# Patient Record
Sex: Female | Born: 1970 | Race: White | Hispanic: No | Smoking: Current every day smoker
Health system: Southern US, Community
[De-identification: ages and names within clinical notes are randomized; demographics above are authoritative.]

## PROBLEM LIST (undated history)

## (undated) DIAGNOSIS — F419 Anxiety disorder, unspecified: Secondary | ICD-10-CM

## (undated) DIAGNOSIS — E039 Hypothyroidism, unspecified: Secondary | ICD-10-CM

## (undated) DIAGNOSIS — T4145XA Adverse effect of unspecified anesthetic, initial encounter: Secondary | ICD-10-CM

## (undated) DIAGNOSIS — D649 Anemia, unspecified: Secondary | ICD-10-CM

## (undated) DIAGNOSIS — R569 Unspecified convulsions: Secondary | ICD-10-CM

## (undated) DIAGNOSIS — R51 Headache: Secondary | ICD-10-CM

## (undated) DIAGNOSIS — K219 Gastro-esophageal reflux disease without esophagitis: Secondary | ICD-10-CM

## (undated) HISTORY — PX: ABDOMINAL HYSTERECTOMY: SHX81

---

## 2000-09-05 ENCOUNTER — Ambulatory Visit (HOSPITAL_COMMUNITY): Admission: RE | Admit: 2000-09-05 | Discharge: 2000-09-05 | Payer: Self-pay | Admitting: Gastroenterology

## 2004-09-21 ENCOUNTER — Ambulatory Visit: Payer: Self-pay | Admitting: Family Medicine

## 2004-11-15 ENCOUNTER — Ambulatory Visit: Payer: Self-pay | Admitting: Family Medicine

## 2005-02-02 ENCOUNTER — Ambulatory Visit: Payer: Self-pay | Admitting: Family Medicine

## 2005-02-13 ENCOUNTER — Ambulatory Visit: Payer: Self-pay | Admitting: Family Medicine

## 2005-04-03 ENCOUNTER — Ambulatory Visit: Payer: Self-pay | Admitting: Family Medicine

## 2005-05-15 ENCOUNTER — Other Ambulatory Visit: Admission: RE | Admit: 2005-05-15 | Discharge: 2005-05-15 | Payer: Self-pay | Admitting: Family Medicine

## 2005-12-15 ENCOUNTER — Inpatient Hospital Stay: Payer: Self-pay | Admitting: Unknown Physician Specialty

## 2006-09-02 ENCOUNTER — Emergency Department (HOSPITAL_COMMUNITY): Admission: EM | Admit: 2006-09-02 | Discharge: 2006-09-02 | Payer: Self-pay | Admitting: Emergency Medicine

## 2006-10-25 ENCOUNTER — Emergency Department (HOSPITAL_COMMUNITY): Admission: EM | Admit: 2006-10-25 | Discharge: 2006-10-25 | Payer: Self-pay | Admitting: Emergency Medicine

## 2006-11-22 ENCOUNTER — Emergency Department (HOSPITAL_COMMUNITY): Admission: EM | Admit: 2006-11-22 | Discharge: 2006-11-22 | Payer: Self-pay | Admitting: Emergency Medicine

## 2007-01-16 ENCOUNTER — Emergency Department (HOSPITAL_COMMUNITY): Admission: EM | Admit: 2007-01-16 | Discharge: 2007-01-16 | Payer: Self-pay | Admitting: Emergency Medicine

## 2007-03-24 ENCOUNTER — Emergency Department (HOSPITAL_COMMUNITY): Admission: EM | Admit: 2007-03-24 | Discharge: 2007-03-24 | Payer: Self-pay | Admitting: Emergency Medicine

## 2007-05-01 ENCOUNTER — Emergency Department (HOSPITAL_COMMUNITY): Admission: EM | Admit: 2007-05-01 | Discharge: 2007-05-01 | Payer: Self-pay | Admitting: Emergency Medicine

## 2007-09-25 ENCOUNTER — Emergency Department (HOSPITAL_COMMUNITY): Admission: EM | Admit: 2007-09-25 | Discharge: 2007-09-25 | Payer: Self-pay | Admitting: Emergency Medicine

## 2007-10-16 ENCOUNTER — Emergency Department (HOSPITAL_COMMUNITY): Admission: EM | Admit: 2007-10-16 | Discharge: 2007-10-16 | Payer: Self-pay | Admitting: Emergency Medicine

## 2008-02-24 ENCOUNTER — Emergency Department (HOSPITAL_COMMUNITY): Admission: EM | Admit: 2008-02-24 | Discharge: 2008-02-24 | Payer: Self-pay | Admitting: Emergency Medicine

## 2008-04-08 ENCOUNTER — Emergency Department (HOSPITAL_COMMUNITY): Admission: EM | Admit: 2008-04-08 | Discharge: 2008-04-08 | Payer: Self-pay | Admitting: Emergency Medicine

## 2008-08-15 ENCOUNTER — Emergency Department (HOSPITAL_COMMUNITY): Admission: EM | Admit: 2008-08-15 | Discharge: 2008-08-15 | Payer: Self-pay | Admitting: Neurology

## 2009-03-16 ENCOUNTER — Emergency Department (HOSPITAL_COMMUNITY): Admission: EM | Admit: 2009-03-16 | Discharge: 2009-03-16 | Payer: Self-pay | Admitting: Emergency Medicine

## 2009-10-28 ENCOUNTER — Emergency Department (HOSPITAL_COMMUNITY): Admission: EM | Admit: 2009-10-28 | Discharge: 2009-10-28 | Payer: Self-pay | Admitting: Emergency Medicine

## 2010-04-25 LAB — DIFFERENTIAL
Basophils Absolute: 0 10*3/uL (ref 0.0–0.1)
Eosinophils Relative: 2 % (ref 0–5)
Lymphs Abs: 2 10*3/uL (ref 0.7–4.0)
Monocytes Relative: 6 % (ref 3–12)
Neutro Abs: 6.4 10*3/uL (ref 1.7–7.7)

## 2010-04-25 LAB — CBC
HCT: 38.5 % (ref 36.0–46.0)
MCHC: 34.1 g/dL (ref 30.0–36.0)
RBC: 4.39 MIL/uL (ref 3.87–5.11)
RDW: 14.9 % (ref 11.5–15.5)
WBC: 9.2 10*3/uL (ref 4.0–10.5)

## 2010-04-25 LAB — BASIC METABOLIC PANEL
CO2: 27 mEq/L (ref 19–32)
Chloride: 103 mEq/L (ref 96–112)
GFR calc Af Amer: >60 mL/min (ref >60)

## 2010-04-25 LAB — POCT CARDIAC MARKERS: Troponin i, poc: <0.05 ng/mL (ref 0.00–0.09)

## 2010-04-25 LAB — D-DIMER, QUANTITATIVE: D-Dimer, Quant: 0.22 ug/mL-FEU (ref 0.00–0.48)

## 2010-05-26 NOTE — Procedures (Signed)
[place]  Patient:    [name], [name] Visit Number: [id number] MRN: [id number]          Service Type: Attending:  Verlin Grills, M.D. Proc. Date: 09/05/00   CC:         Dr. Samuel Jester   Procedure Report  DATE OF BIRTH:  [birth year]  PROCEDURE PERFORMED:  Esophagogastroduodenoscopy.  REFERRING PHYSICIAN:  Dr. Samuel Jester  ENDOSCOPIST:  Verlin Grills, M.D.  INDICATIONS FOR PROCEDURE:  Ms. [name] is a 40 year old female with chronic gastroesophageal reflux disease.  Approximately five years ago she received therapy for Helicobacter pylori antral gastritis.  Despite taking Prilosec 20 mg twice daily, she experiences heartburn without dysphagia or odynophagia.  The patient viewed our esophagogastroduodenoscopy education film.  I discussed laparoscopic antireflux surgery with her.  I gave her samples of Nexium 40 mg each morning.  She is scheduled for a preoperative esophagogastroduodenoscopy.  MEDICATION ALLERGIES:  None.  CHRONIC MEDICATIONS:  Tylenol, Xanax, Zoloft, Nexium.  PAST MEDICAL HISTORY:  Gastroesophageal reflux disease.  PREMEDICATION:  Versed 7.5 mg, fentanyl 50 mcg.  INSTRUMENT USED:  Olympus gastroscope.  DESCRIPTION OF PROCEDURE:  After obtaining informed consent, [name] was placed in the left lateral decubitus position.  I administered intravenous Versed and intravenous fentanyl to achieve conscious sedation for the procedure.  The patients blood pressure, oxygen saturation and cardiac rhythm were monitored throughout the procedure and documented in the medical record.  The Olympus gastroscope was passed through the posterior hypopharynx into the proximal esophagus without difficulty.  The hypopharynx, larynx and vocal cords appeared normal.  Esophagoscopy:  The proximal, mid and lower segments of the esophagus appeared normal.  The squamocolumnar junction and the esophagogastric  junction are noted at approximately 36 cm from the incisor teeth.  Endoscopically there is no evidence for the presence of Barretts esophagus, erosive esophagitis, esophageal mucosa scarring or esophageal ulceration.  Gastroscopy:  Retroflex view of the gastric cardia and fundus was normal.  The gastric body, antrum, and pylorus appeared normal.  A biopsy was taken from the gastric antrum for CLO test.  Duodenoscopy:  The duodenal bulb, mid duodenum and distal duodenum appeared normal.  ASSESSMENT:  Gastroesophageal reflux disease associated with a normal esophagogastroduodenoscopy.  CLO test to rule out Helicobacter pylori antral gastritis pending.  PLAN:  Schedule esophageal manometry and 24 hour esophageal pH study off proton pump inhibitor therapy. Attending:  Verlin Grills, M.D. DD:  09/05/00 TD:  09/05/00 Job: [id number]

## 2010-09-28 LAB — DIFFERENTIAL
Basophils Absolute: 0
Eosinophils Absolute: 0.1
Monocytes Absolute: 0.6
Neutrophils Relative %: 73

## 2010-09-28 LAB — URINALYSIS, ROUTINE W REFLEX MICROSCOPIC
Hgb urine dipstick: NEGATIVE
Nitrite: NEGATIVE
Protein, ur: 30 — AB

## 2010-09-28 LAB — COMPREHENSIVE METABOLIC PANEL
ALT: 30
Albumin: 3.8
Alkaline Phosphatase: 74
BUN: 18
Calcium: 8.8
GFR calc Af Amer: >60
GFR calc non Af Amer: >60
Potassium: 3.1 — ABNORMAL LOW
Total Protein: 6.7

## 2010-09-28 LAB — URINE MICROSCOPIC-ADD ON

## 2010-09-28 LAB — CBC
Hemoglobin: 14.1
MCHC: 34.2
RBC: 4.61

## 2011-01-09 DIAGNOSIS — T8859XA Other complications of anesthesia, initial encounter: Secondary | ICD-10-CM

## 2011-01-09 HISTORY — DX: Other complications of anesthesia, initial encounter: T88.59XA

## 2011-02-19 ENCOUNTER — Emergency Department (HOSPITAL_COMMUNITY): Payer: Medicaid Other

## 2011-02-19 ENCOUNTER — Encounter (HOSPITAL_COMMUNITY): Admission: EM | Disposition: A | Discharge status: Home Health | Payer: Self-pay | Source: Home / Self Care

## 2011-02-19 ENCOUNTER — Inpatient Hospital Stay (HOSPITAL_COMMUNITY): Payer: Medicaid Other

## 2011-02-19 ENCOUNTER — Encounter (HOSPITAL_COMMUNITY): Payer: Self-pay | Admitting: *Deleted

## 2011-02-19 ENCOUNTER — Inpatient Hospital Stay (HOSPITAL_COMMUNITY)
Admission: EM | Admit: 2011-02-19 | Discharge: 2011-02-23 | DRG: 473 | Disposition: A | Discharge status: Home Health | Payer: Medicaid Other | Attending: General Surgery | Admitting: General Surgery

## 2011-02-19 ENCOUNTER — Inpatient Hospital Stay (HOSPITAL_COMMUNITY): Payer: Medicaid Other | Admitting: Anesthesiology

## 2011-02-19 ENCOUNTER — Encounter (HOSPITAL_COMMUNITY): Payer: Self-pay | Admitting: Anesthesiology

## 2011-02-19 DIAGNOSIS — K219 Gastro-esophageal reflux disease without esophagitis: Secondary | ICD-10-CM | POA: Diagnosis present

## 2011-02-19 DIAGNOSIS — E039 Hypothyroidism, unspecified: Secondary | ICD-10-CM | POA: Diagnosis present

## 2011-02-19 DIAGNOSIS — T07XXXA Unspecified multiple injuries, initial encounter: Secondary | ICD-10-CM

## 2011-02-19 DIAGNOSIS — F411 Generalized anxiety disorder: Secondary | ICD-10-CM | POA: Diagnosis present

## 2011-02-19 DIAGNOSIS — S129XXA Fracture of neck, unspecified, initial encounter: Secondary | ICD-10-CM

## 2011-02-19 DIAGNOSIS — S12400A Unspecified displaced fracture of fifth cervical vertebra, initial encounter for closed fracture: Secondary | ICD-10-CM | POA: Diagnosis present

## 2011-02-19 DIAGNOSIS — Z23 Encounter for immunization: Secondary | ICD-10-CM

## 2011-02-19 DIAGNOSIS — Z79899 Other long term (current) drug therapy: Secondary | ICD-10-CM

## 2011-02-19 DIAGNOSIS — S12500A Unspecified displaced fracture of sixth cervical vertebra, initial encounter for closed fracture: Secondary | ICD-10-CM | POA: Diagnosis present

## 2011-02-19 DIAGNOSIS — M25511 Pain in right shoulder: Secondary | ICD-10-CM

## 2011-02-19 DIAGNOSIS — S12300A Unspecified displaced fracture of fourth cervical vertebra, initial encounter for closed fracture: Principal | ICD-10-CM | POA: Diagnosis present

## 2011-02-19 DIAGNOSIS — Z888 Allergy status to other drugs, medicaments and biological substances status: Secondary | ICD-10-CM

## 2011-02-19 HISTORY — PX: ANTERIOR CERVICAL DECOMP/DISCECTOMY FUSION: SHX1161

## 2011-02-19 HISTORY — DX: Anxiety disorder, unspecified: F41.9

## 2011-02-19 HISTORY — DX: Gastro-esophageal reflux disease without esophagitis: K21.9

## 2011-02-19 HISTORY — DX: Hypothyroidism, unspecified: E03.9

## 2011-02-19 HISTORY — PX: POSTERIOR CERVICAL FUSION/FORAMINOTOMY: SHX5038

## 2011-02-19 LAB — CBC
HCT: 35.4 % — ABNORMAL LOW (ref 36.0–46.0)
Hemoglobin: 12 g/dL (ref 12.0–15.0)
MCH: 30 pg (ref 26.0–34.0)
MCHC: 33.9 g/dL (ref 30.0–36.0)
MCV: 88.5 fL (ref 78.0–100.0)
Platelets: 236 10*3/uL (ref 150–400)
RBC: 4 MIL/uL (ref 3.87–5.11)
RDW: 13.1 % (ref 11.5–15.5)
WBC: 18.7 10*3/uL — ABNORMAL HIGH (ref 4.0–10.5)

## 2011-02-19 LAB — COMPREHENSIVE METABOLIC PANEL
ALT: 22 U/L (ref 0–35)
AST: 28 U/L (ref 0–37)
Albumin: 3.7 g/dL (ref 3.5–5.2)
Alkaline Phosphatase: 71 U/L (ref 39–117)
BUN: 11 mg/dL (ref 6–23)
CO2: 26 mEq/L (ref 19–32)
Calcium: 8.7 mg/dL (ref 8.4–10.5)
Chloride: 103 mEq/L (ref 96–112)
Creatinine, Ser: 0.64 mg/dL (ref 0.50–1.10)
GFR calc Af Amer: >90 mL/min (ref >90)
GFR calc non Af Amer: >90 mL/min (ref >90)
Glucose, Bld: 96 mg/dL (ref 70–99)
Potassium: 3.5 mEq/L (ref 3.5–5.1)
Sodium: 139 mEq/L (ref 135–145)
Total Bilirubin: 0.5 mg/dL (ref 0.3–1.2)
Total Protein: 6.6 g/dL (ref 6.0–8.3)

## 2011-02-19 LAB — URINALYSIS, ROUTINE W REFLEX MICROSCOPIC
Leukocytes, UA: NEGATIVE
Nitrite: NEGATIVE
Specific Gravity, Urine: 1.015 (ref 1.005–1.030)
pH: 6.5 (ref 5.0–8.0)

## 2011-02-19 LAB — MRSA PCR SCREENING: MRSA by PCR: NEGATIVE

## 2011-02-19 SURGERY — ANTERIOR CERVICAL DECOMPRESSION/DISCECTOMY FUSION 2 LEVELS
Anesthesia: General | Site: Spine Cervical | Wound class: Clean

## 2011-02-19 MED ORDER — HYDROXYZINE HCL 50 MG PO TABS
50.0000 mg | ORAL_TABLET | ORAL | Status: DC | PRN
  Filled 2011-02-19: qty 1

## 2011-02-19 MED ORDER — NEOSTIGMINE METHYLSULFATE 1 MG/ML IJ SOLN
INTRAMUSCULAR | Status: DC | PRN
  Administered 2011-02-19: 2 mg via INTRAVENOUS

## 2011-02-19 MED ORDER — BACITRACIN 50000 UNITS IM SOLR
INTRAMUSCULAR | Status: AC
  Filled 2011-02-19: qty 1

## 2011-02-19 MED ORDER — PROPOFOL 10 MG/ML IV BOLUS
INTRAVENOUS | Status: DC | PRN
  Administered 2011-02-19: 200 mg via INTRAVENOUS

## 2011-02-19 MED ORDER — ONDANSETRON HCL 4 MG/2ML IJ SOLN
INTRAMUSCULAR | Status: DC | PRN
  Administered 2011-02-19: 4 mg via INTRAVENOUS

## 2011-02-19 MED ORDER — POTASSIUM CHLORIDE IN NACL 20-0.9 MEQ/L-% IV SOLN
INTRAVENOUS | Status: DC
  Filled 2011-02-19 (×2): qty 1000

## 2011-02-19 MED ORDER — FENTANYL CITRATE 0.05 MG/ML IJ SOLN
INTRAMUSCULAR | Status: AC
  Administered 2011-02-20: 75 ug via INTRAVENOUS
  Filled 2011-02-19: qty 2

## 2011-02-19 MED ORDER — KETOROLAC TROMETHAMINE 30 MG/ML IJ SOLN
INTRAMUSCULAR | Status: AC
  Administered 2011-02-19: 30 mg via INTRAVENOUS
  Filled 2011-02-19: qty 1

## 2011-02-19 MED ORDER — MORPHINE SULFATE 4 MG/ML IJ SOLN
4.0000 mg | INTRAMUSCULAR | Status: DC | PRN
  Administered 2011-02-19 – 2011-02-20 (×2): 4 mg via INTRAMUSCULAR
  Filled 2011-02-19 (×4): qty 1

## 2011-02-19 MED ORDER — SODIUM CHLORIDE 0.9 % IV SOLN
INTRAVENOUS | Status: AC
  Filled 2011-02-19: qty 500

## 2011-02-19 MED ORDER — HYDROMORPHONE HCL PF 1 MG/ML IJ SOLN
INTRAMUSCULAR | Status: AC
  Administered 2011-02-19: 0.25 mg via INTRAVENOUS
  Filled 2011-02-19: qty 1

## 2011-02-19 MED ORDER — LIDOCAINE-EPINEPHRINE 1 %-1:100000 IJ SOLN
INTRAMUSCULAR | Status: DC | PRN
  Administered 2011-02-19: 5 mL

## 2011-02-19 MED ORDER — KETOROLAC TROMETHAMINE 30 MG/ML IJ SOLN
30.0000 mg | Freq: Four times a day (QID) | INTRAMUSCULAR | Status: AC
  Administered 2011-02-20 – 2011-02-21 (×8): 30 mg via INTRAVENOUS
  Filled 2011-02-19 (×9): qty 1

## 2011-02-19 MED ORDER — SODIUM CHLORIDE 0.9 % IJ SOLN: 3.0000 mL | INTRAMUSCULAR | Status: DC | PRN

## 2011-02-19 MED ORDER — MENTHOL 3 MG MT LOZG: 1.0000 | LOZENGE | OROMUCOSAL | Status: DC | PRN

## 2011-02-19 MED ORDER — PANTOPRAZOLE SODIUM 40 MG PO TBEC
40.0000 mg | DELAYED_RELEASE_TABLET | Freq: Every day | ORAL | Status: DC
  Administered 2011-02-20 – 2011-02-23 (×4): 40 mg via ORAL
  Filled 2011-02-19 (×4): qty 1

## 2011-02-19 MED ORDER — CEFAZOLIN SODIUM 1-5 GM-% IV SOLN
1.0000 g | INTRAVENOUS | Status: AC
  Administered 2011-02-19: 1 g via INTRAVENOUS
  Filled 2011-02-19 (×3): qty 50

## 2011-02-19 MED ORDER — SUCCINYLCHOLINE CHLORIDE 20 MG/ML IJ SOLN
INTRAMUSCULAR | Status: DC | PRN
  Administered 2011-02-19: 140 mg via INTRAVENOUS

## 2011-02-19 MED ORDER — ONDANSETRON HCL 4 MG/2ML IJ SOLN: 4.0000 mg | Freq: Four times a day (QID) | INTRAMUSCULAR | Status: DC | PRN

## 2011-02-19 MED ORDER — BACITRACIN ZINC 500 UNIT/GM EX OINT
TOPICAL_OINTMENT | CUTANEOUS | Status: AC
  Administered 2011-02-19: 13:00:00
  Filled 2011-02-19: qty 1.8

## 2011-02-19 MED ORDER — ACETAMINOPHEN 650 MG RE SUPP: 650.0000 mg | RECTAL | Status: DC | PRN

## 2011-02-19 MED ORDER — PANTOPRAZOLE SODIUM 40 MG IV SOLR
40.0000 mg | Freq: Every day | INTRAVENOUS | Status: DC
  Filled 2011-02-19 (×3): qty 40

## 2011-02-19 MED ORDER — HYDROMORPHONE HCL PF 1 MG/ML IJ SOLN
0.2500 mg | INTRAMUSCULAR | Status: DC | PRN
  Administered 2011-02-19: 0.25 mg via INTRAVENOUS
  Administered 2011-02-19: 0.5 mg via INTRAVENOUS

## 2011-02-19 MED ORDER — ONDANSETRON HCL 4 MG PO TABS: 4.0000 mg | ORAL_TABLET | Freq: Four times a day (QID) | ORAL | Status: DC | PRN

## 2011-02-19 MED ORDER — HYDROXYZINE HCL 50 MG/ML IM SOLN
50.0000 mg | INTRAMUSCULAR | Status: DC | PRN
  Filled 2011-02-19: qty 1

## 2011-02-19 MED ORDER — CYCLOBENZAPRINE HCL 10 MG PO TABS
10.0000 mg | ORAL_TABLET | Freq: Three times a day (TID) | ORAL | Status: DC | PRN
  Administered 2011-02-20 – 2011-02-23 (×8): 10 mg via ORAL
  Filled 2011-02-19 (×8): qty 1

## 2011-02-19 MED ORDER — SODIUM CHLORIDE 0.9 % IV BOLUS (SEPSIS)
1000.0000 mL | Freq: Once | INTRAVENOUS | Status: AC
  Administered 2011-02-19: 1000 mL via INTRAVENOUS

## 2011-02-19 MED ORDER — 0.9 % SODIUM CHLORIDE (POUR BTL) OPTIME
TOPICAL | Status: DC | PRN
  Administered 2011-02-19: 1000 mL

## 2011-02-19 MED ORDER — IOHEXOL 350 MG/ML SOLN
75.0000 mL | Freq: Once | INTRAVENOUS | Status: AC | PRN
  Administered 2011-02-19: 75 mL via INTRAVENOUS

## 2011-02-19 MED ORDER — SODIUM CHLORIDE 0.9 % IJ SOLN
3.0000 mL | Freq: Two times a day (BID) | INTRAMUSCULAR | Status: DC
  Administered 2011-02-19 – 2011-02-23 (×8): 3 mL via INTRAVENOUS

## 2011-02-19 MED ORDER — EPHEDRINE SULFATE 50 MG/ML IJ SOLN
INTRAMUSCULAR | Status: DC | PRN
  Administered 2011-02-19 (×2): 10 mg via INTRAVENOUS

## 2011-02-19 MED ORDER — FENTANYL CITRATE 0.05 MG/ML IJ SOLN
50.0000 ug | INTRAMUSCULAR | Status: DC | PRN
  Administered 2011-02-19: 50 ug via INTRAVENOUS
  Administered 2011-02-20: 75 ug via INTRAVENOUS
  Administered 2011-02-22: 50 ug via INTRAVENOUS
  Filled 2011-02-19 (×2): qty 2

## 2011-02-19 MED ORDER — FENTANYL CITRATE 0.05 MG/ML IJ SOLN
INTRAMUSCULAR | Status: DC | PRN
  Administered 2011-02-19 (×8): 50 ug via INTRAVENOUS
  Administered 2011-02-19: 100 ug via INTRAVENOUS

## 2011-02-19 MED ORDER — ACETAMINOPHEN 325 MG PO TABS: 650.0000 mg | ORAL_TABLET | ORAL | Status: DC | PRN

## 2011-02-19 MED ORDER — ALUM & MAG HYDROXIDE-SIMETH 200-200-20 MG/5ML PO SUSP: 30.0000 mL | Freq: Four times a day (QID) | ORAL | Status: DC | PRN

## 2011-02-19 MED ORDER — ONDANSETRON HCL 4 MG/2ML IJ SOLN
INTRAMUSCULAR | Status: AC
  Filled 2011-02-19: qty 2

## 2011-02-19 MED ORDER — THROMBIN 5000 UNITS EX SOLR
OROMUCOSAL | Status: DC | PRN
  Administered 2011-02-19: 18:00:00 via TOPICAL

## 2011-02-19 MED ORDER — BISACODYL 10 MG RE SUPP: 10.0000 mg | Freq: Every day | RECTAL | Status: DC | PRN

## 2011-02-19 MED ORDER — HYDROMORPHONE HCL PF 1 MG/ML IJ SOLN
1.0000 mg | Freq: Once | INTRAMUSCULAR | Status: AC
  Administered 2011-02-19: 1 mg via INTRAVENOUS
  Filled 2011-02-19: qty 1

## 2011-02-19 MED ORDER — THROMBIN 20000 UNITS EX KIT
PACK | CUTANEOUS | Status: DC | PRN
  Administered 2011-02-19: 17:00:00 via TOPICAL

## 2011-02-19 MED ORDER — MORPHINE SULFATE 4 MG/ML IJ SOLN
4.0000 mg | Freq: Once | INTRAMUSCULAR | Status: AC
  Administered 2011-02-19: 4 mg via INTRAVENOUS

## 2011-02-19 MED ORDER — MIDAZOLAM HCL 5 MG/5ML IJ SOLN
INTRAMUSCULAR | Status: DC | PRN
  Administered 2011-02-19 (×2): 1 mg via INTRAVENOUS

## 2011-02-19 MED ORDER — MAGNESIUM HYDROXIDE 400 MG/5ML PO SUSP: 30.0000 mL | Freq: Every day | ORAL | Status: DC | PRN

## 2011-02-19 MED ORDER — OXYCODONE-ACETAMINOPHEN 5-325 MG PO TABS
1.0000 | ORAL_TABLET | ORAL | Status: DC | PRN
  Administered 2011-02-22 – 2011-02-23 (×5): 2 via ORAL
  Filled 2011-02-19 (×6): qty 2

## 2011-02-19 MED ORDER — KCL IN DEXTROSE-NACL 20-5-0.45 MEQ/L-%-% IV SOLN
INTRAVENOUS | Status: DC
  Administered 2011-02-19 – 2011-02-21 (×3): via INTRAVENOUS
  Filled 2011-02-19 (×6): qty 1000

## 2011-02-19 MED ORDER — KETOROLAC TROMETHAMINE 30 MG/ML IJ SOLN
30.0000 mg | Freq: Once | INTRAMUSCULAR | Status: AC
  Administered 2011-02-19: 30 mg via INTRAVENOUS

## 2011-02-19 MED ORDER — GLYCOPYRROLATE 0.2 MG/ML IJ SOLN
INTRAMUSCULAR | Status: DC | PRN
  Administered 2011-02-19: .4 mg via INTRAVENOUS

## 2011-02-19 MED ORDER — MORPHINE SULFATE 4 MG/ML IJ SOLN
INTRAMUSCULAR | Status: AC
  Filled 2011-02-19: qty 1

## 2011-02-19 MED ORDER — PHENOL 1.4 % MT LIQD
1.0000 | OROMUCOSAL | Status: DC | PRN
  Administered 2011-02-20: 1 via OROMUCOSAL
  Filled 2011-02-19: qty 177

## 2011-02-19 MED ORDER — SODIUM CHLORIDE 0.9 % IV SOLN: 250.0000 mL | INTRAVENOUS | Status: DC

## 2011-02-19 MED ORDER — BUPIVACAINE HCL (PF) 0.5 % IJ SOLN
INTRAMUSCULAR | Status: DC | PRN
  Administered 2011-02-19: 5 mL

## 2011-02-19 MED ORDER — VECURONIUM BROMIDE 10 MG IV SOLR
INTRAVENOUS | Status: DC | PRN
  Administered 2011-02-19: 10 mg via INTRAVENOUS

## 2011-02-19 MED ORDER — LACTATED RINGERS IV SOLN
INTRAVENOUS | Status: DC | PRN
  Administered 2011-02-19 (×2): via INTRAVENOUS

## 2011-02-19 MED ORDER — ONDANSETRON HCL 4 MG/2ML IJ SOLN
4.0000 mg | Freq: Once | INTRAMUSCULAR | Status: AC
  Administered 2011-02-19: 4 mg via INTRAVENOUS

## 2011-02-19 MED ORDER — HYDROCODONE-ACETAMINOPHEN 5-325 MG PO TABS
1.0000 | ORAL_TABLET | ORAL | Status: DC | PRN
  Administered 2011-02-20: 1 via ORAL
  Administered 2011-02-21 – 2011-02-22 (×5): 2 via ORAL
  Filled 2011-02-19 (×3): qty 2
  Filled 2011-02-19: qty 1
  Filled 2011-02-19 (×2): qty 2

## 2011-02-19 MED ORDER — SODIUM CHLORIDE 0.9 % IR SOLN
Status: DC | PRN
  Administered 2011-02-19: 17:00:00

## 2011-02-19 MED ORDER — TETANUS-DIPHTH-ACELL PERTUSSIS 5-2.5-18.5 LF-MCG/0.5 IM SUSP
0.5000 mL | Freq: Once | INTRAMUSCULAR | Status: AC
  Administered 2011-02-19: 0.5 mL via INTRAMUSCULAR
  Filled 2011-02-19: qty 0.5

## 2011-02-19 SURGICAL SUPPLY — 85 items
ADH SKN CLS APL DERMABOND .7 (GAUZE/BANDAGES/DRESSINGS) ×2
ADH SKN CLS LQ APL DERMABOND (GAUZE/BANDAGES/DRESSINGS) ×2
ALLOGRAFT CA 6X14X11 (Bone Implant) ×2 IMPLANT
BAG DECANTER FOR FLEXI CONT (MISCELLANEOUS) ×5 IMPLANT
BANDAGE GAUZE 4  KLING STR (GAUZE/BANDAGES/DRESSINGS) IMPLANT
BIT DRILL NEURO 2X3.1 SFT TUCH (MISCELLANEOUS) ×4 IMPLANT
BIT DRILL WIRE PASS 1.3MM (BIT) IMPLANT
BLADE SURG 11 STRL SS (BLADE) ×3 IMPLANT
BLADE SURG ROTATE 9660 (MISCELLANEOUS) ×2 IMPLANT
BLADE ULTRA TIP 2M (BLADE) ×3 IMPLANT
BRUSH SCRUB EZ PLAIN DRY (MISCELLANEOUS) ×3 IMPLANT
CANISTER SUCTION 2500CC (MISCELLANEOUS) ×5 IMPLANT
CLIP TI MEDIUM 6 (CLIP) ×1 IMPLANT
CLOTH BEACON ORANGE TIMEOUT ST (SAFETY) ×5 IMPLANT
CONT SPEC 4OZ CLIKSEAL STRL BL (MISCELLANEOUS) ×5 IMPLANT
COVER MAYO STAND STRL (DRAPES) ×3 IMPLANT
COVER TABLE BACK 60X90 (DRAPES) ×2 IMPLANT
DECANTER SPIKE VIAL GLASS SM (MISCELLANEOUS) ×4 IMPLANT
DERMABOND ADHESIVE PROPEN (GAUZE/BANDAGES/DRESSINGS) ×1
DERMABOND ADVANCED (GAUZE/BANDAGES/DRESSINGS) ×1
DERMABOND ADVANCED .7 DNX12 (GAUZE/BANDAGES/DRESSINGS) ×4 IMPLANT
DERMABOND ADVANCED .7 DNX6 (GAUZE/BANDAGES/DRESSINGS) IMPLANT
DRAPE C-ARM 42X72 X-RAY (DRAPES) ×4 IMPLANT
DRAPE LAPAROTOMY 100X72 PEDS (DRAPES) ×5 IMPLANT
DRAPE MICROSCOPE LEICA (MISCELLANEOUS) ×2 IMPLANT
DRAPE POUCH INSTRU U-SHP 10X18 (DRAPES) ×5 IMPLANT
DRAPE PROXIMA HALF (DRAPES) IMPLANT
DRILL NEURO 2X3.1 SOFT TOUCH (MISCELLANEOUS) ×3
DRILL WIRE PASS 1.3MM (BIT)
DRSG EMULSION OIL 3X3 NADH (GAUZE/BANDAGES/DRESSINGS) IMPLANT
ELECT COATED BLADE 2.86 ST (ELECTRODE) ×3 IMPLANT
ELECT REM PT RETURN 9FT ADLT (ELECTROSURGICAL) ×3
ELECTRODE REM PT RTRN 9FT ADLT (ELECTROSURGICAL) ×4 IMPLANT
EVACUATOR 1/8 PVC DRAIN (DRAIN) IMPLANT
GAUZE SPONGE 4X4 16PLY XRAY LF (GAUZE/BANDAGES/DRESSINGS) ×1 IMPLANT
GLOVE BIOGEL PI IND STRL 7.5 (GLOVE) IMPLANT
GLOVE BIOGEL PI IND STRL 8 (GLOVE) ×4 IMPLANT
GLOVE BIOGEL PI IND STRL 8.5 (GLOVE) IMPLANT
GLOVE BIOGEL PI INDICATOR 7.5 (GLOVE) ×1
GLOVE BIOGEL PI INDICATOR 8 (GLOVE) ×2
GLOVE BIOGEL PI INDICATOR 8.5 (GLOVE) ×2
GLOVE ECLIPSE 7.5 STRL STRAW (GLOVE) ×7 IMPLANT
GLOVE ECLIPSE 8.5 STRL (GLOVE) ×1 IMPLANT
GLOVE EXAM NITRILE LRG STRL (GLOVE) IMPLANT
GLOVE EXAM NITRILE MD LF STRL (GLOVE) IMPLANT
GLOVE EXAM NITRILE XL STR (GLOVE) IMPLANT
GLOVE EXAM NITRILE XS STR PU (GLOVE) IMPLANT
GLOVE SURG SS PI 6.5 STRL IVOR (GLOVE) ×1 IMPLANT
GOWN BRE IMP SLV AUR LG STRL (GOWN DISPOSABLE) ×1 IMPLANT
GOWN BRE IMP SLV AUR XL STRL (GOWN DISPOSABLE) ×3 IMPLANT
GOWN STRL REIN 2XL LVL4 (GOWN DISPOSABLE) ×2 IMPLANT
HEAD HALTER (SOFTGOODS) ×3 IMPLANT
HEMOSTAT SURGICEL 2X14 (HEMOSTASIS) IMPLANT
KIT BASIN OR (CUSTOM PROCEDURE TRAY) ×5 IMPLANT
KIT ROOM TURNOVER OR (KITS) ×5 IMPLANT
NDL HYPO 18GX1.5 BLUNT FILL (NEEDLE) IMPLANT
NDL HYPO 25X1 1.5 SAFETY (NEEDLE) ×2 IMPLANT
NDL SPNL 18GX3.5 QUINCKE PK (NEEDLE) ×2 IMPLANT
NDL SPNL 22GX3.5 QUINCKE BK (NEEDLE) ×4 IMPLANT
NEEDLE HYPO 18GX1.5 BLUNT FILL (NEEDLE) IMPLANT
NEEDLE HYPO 25X1 1.5 SAFETY (NEEDLE) ×3 IMPLANT
NEEDLE SPNL 18GX3.5 QUINCKE PK (NEEDLE) IMPLANT
NEEDLE SPNL 22GX3.5 QUINCKE BK (NEEDLE) ×6 IMPLANT
NS IRRIG 1000ML POUR BTL (IV SOLUTION) ×5 IMPLANT
PACK LAMINECTOMY NEURO (CUSTOM PROCEDURE TRAY) ×5 IMPLANT
PAD ARMBOARD 7.5X6 YLW CONV (MISCELLANEOUS) ×15 IMPLANT
PIN MAYFIELD SKULL DISP (PIN) ×2 IMPLANT
RUBBERBAND STERILE (MISCELLANEOUS) ×6 IMPLANT
SPONGE GAUZE 4X4 12PLY (GAUZE/BANDAGES/DRESSINGS) IMPLANT
SPONGE INTESTINAL PEANUT (DISPOSABLE) ×3 IMPLANT
SPONGE LAP 4X18 X RAY DECT (DISPOSABLE) IMPLANT
SPONGE SURGIFOAM ABS GEL 100 (HEMOSTASIS) ×3 IMPLANT
SPONGE SURGIFOAM ABS GEL SZ50 (HEMOSTASIS) ×2 IMPLANT
STAPLER SKIN PROX WIDE 3.9 (STAPLE) ×2 IMPLANT
SUT ETHILON 3 0 FSL (SUTURE) IMPLANT
SUT VIC AB 0 CT1 18XCR BRD8 (SUTURE) ×2 IMPLANT
SUT VIC AB 0 CT1 8-18 (SUTURE) ×3
SUT VIC AB 2-0 CP2 18 (SUTURE) ×5 IMPLANT
SUT VIC AB 3-0 SH 8-18 (SUTURE) ×3 IMPLANT
SYR 20ML ECCENTRIC (SYRINGE) ×5 IMPLANT
TOWEL OR 17X24 6PK STRL BLUE (TOWEL DISPOSABLE) ×5 IMPLANT
TOWEL OR 17X26 10 PK STRL BLUE (TOWEL DISPOSABLE) ×5 IMPLANT
TRAY FOLEY CATH 14FRSI W/METER (CATHETERS) IMPLANT
UNDERPAD 30X30 INCONTINENT (UNDERPADS AND DIAPERS) ×2 IMPLANT
WATER STERILE IRR 1000ML POUR (IV SOLUTION) ×5 IMPLANT

## 2011-02-19 NOTE — Plan of Care (Signed)
Problem: Consults Goal: Diagnosis - Spinal Surgery Outcome: Completed/Met Date Met:  02/19/11 Cervical Spine Fusion for Cervical fractures
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
## 2011-02-19 NOTE — Op Note (Signed)
02/19/2011  5:35 PM  PATIENT:  [name]  41 y.o. female  PRE-OPERATIVE DIAGNOSIS:  cervical fracture dislocation  POST-OPERATIVE DIAGNOSIS:  Cervical fracture Dislocation  PROCEDURE:  Procedure(s): ANTERIOR CERVICAL DECOMPRESSION/DISCECTOMY FUSION 2 LEVELS: C4-5 and C5-6 anterior cervical decompression and arthrodesis with allograft and tether cervical plating  SURGEON:  Surgeon(s): [name], MD Stefani Dama, MD  ASSISTANTS: Stefani Dama M.D.  ANESTHESIA:   general  EBL:  Total I/O In: 2000 [I.V.:2000] Out: 975 [Urine:950; Blood:25]  BLOOD ADMINISTERED:none  COUNT: Correct per nursing staff  DICTATION: Patient was brought to the operating room placed under general endotracheal anesthesia. Patient was placed in 10 pounds of halter traction. The neck was prepped with Betadine soap and solution and draped in a sterile fashion. A horizontal incision was made on the left side of the neck. The line of the incision was infiltrated with local anesthetic with epinephrine. Dissection was carried down thru the subcutaneous tissue and platysma, bipolar cautery was used to maintain hemostasis. Dissection was then carried out thru an avascular plane leaving the sternocleidomastoid carotid artery and jugular vein laterally and the trachea and esophagus medially. The ventral aspect of the vertebral column was identified and a localizing x-ray was taken. The C4-5 and C5-6 levels were identified. The annulus at each level was incised and the disc space entered. Discectomy was performed with micro-curettes and pituitary rongeurs. There was notable laxity of the intravertebral disc space at the C5-6 level. The operating microscope was draped and brought into the field provided additional magnification illumination and visualization. Discectomy was continued posteriorly thru the disc space and then the cartilaginous endplate was removed using micro-curettes along with the high-speed drill.  Posterior osteophytic overgrowth was removed each level using the high-speed drill along with a 2 mm thin footplated Kerrison punch. Posterior longitudinal ligament was carefully removed, decompressing the spinal canal and thecal sac. We then continued to remove osteophytic overgrowth and disc material decompressing the neural foramina and exiting nerve roots bilaterally. We did find a fairly large fragment of disc laterally and the left C5-6 neural foramen. Once the decompression was completed hemostasis was established at each level with the use of Gelfoam with thrombin and bipolar cautery. The Gelfoam was removed the wound irrigated and hemostasis confirmed. We then measured the height of the intravertebral disc space level and selected a 6 millimeter in height structural allograft for the C4-5 level and a 6 millimeter in height structural allograft for the C5-6 level . Each was hydrated and saline solution and then gently positioned in the intravertebral disc space and countersunk. We then selected a 32 millimeter in height Tether cervical plate. It was positioned over the fusion construct and secured to the vertebra with 4 x 13 mm screws, placing a fixed screw at C5 and a pair of variable screws at C4 and C6. Each screw hole was started with the high-speed drill and then the screws placed, once all the screws were placed final tightening was performed. The wound was irrigated with bacitracin solution checked for hemostasis which was established and confirmed. An x-ray was taken which showed grafts in good position, the plate and screws in good position, the overall alignment to be good, and it was felt that we achieved good reduction of the anterolisthesis C5 on 6. We then proceeded with closure. The platysma was closed with interrupted inverted 2-0 undyed Vicryl suture, the subcutaneous and subcuticular closed with interrupted inverted 3-0 undyed Vicryl suture. The skin edges were  approximated with Dermabond.  Following surgery the patient was taken out of cervical traction. To be reversed and the anesthetic and taken to the recovery room for further care.   PLAN OF CARE: Admit to inpatient   PATIENT DISPOSITION:  PACU - hemodynamically stable.   Delay start of Pharmacological VTE agent (>24hrs) due to surgical blood loss or risk of bleeding:  yes

## 2011-02-19 NOTE — ED Notes (Signed)
Attempt an i&o unable to get urine at this time
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
## 2011-02-19 NOTE — Transfer of Care (Signed)
Immediate Anesthesia Transfer of Care Note  Patient: [name]  Procedure(s) Performed: Procedure(s) (LRB): ANTERIOR CERVICAL DECOMPRESSION/DISCECTOMY FUSION 2 LEVELS (N/A) POSTERIOR CERVICAL FUSION/FORAMINOTOMY LEVEL 2 (N/A)  Patient Location: PACU  Anesthesia Type: General  Level of Consciousness: awake  Airway & Oxygen Therapy: Patient Spontanous Breathing and Patient connected to nasal cannula oxygen  Post-op Assessment: Report given to PACU RN and Patient moving all extremities X 4  Post vital signs: Reviewed and stable  Complications: No apparent anesthesia complications

## 2011-02-19 NOTE — H&P (Signed)
Patient discussed with Dr. Newell Coral.  Completing work-up with CTA C-spine, CT chest/abdomen/pelvis now.  Plan to go to the OR with Dr. Newell Coral afterwards. Patient examined and I agree with the assessment and plan  Violeta Gelinas, MD, MPH, FACS Pager: [phone]  02/19/2011 1:47 PM

## 2011-02-19 NOTE — Anesthesia Postprocedure Evaluation (Signed)
  Anesthesia Post-op Note  Patient: [name]  Procedure(s) Performed: Procedure(s) (LRB): ANTERIOR CERVICAL DECOMPRESSION/DISCECTOMY FUSION 2 LEVELS (N/A) POSTERIOR CERVICAL FUSION/FORAMINOTOMY LEVEL 2 (N/A)  Patient Location: PACU  Anesthesia Type: General  Level of Consciousness: awake, alert , oriented and patient cooperative  Airway and Oxygen Therapy: Patient Spontanous Breathing and Patient connected to nasal cannula oxygen  Post-op Pain: mild  Post-op Assessment: Post-op Vital signs reviewed, Patient's Cardiovascular Status Stable, Respiratory Function Stable, Patent Airway, No signs of Nausea or vomiting and Pain level controlled  Post-op Vital Signs: stable  Complications: No apparent anesthesia complications
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
## 2011-02-19 NOTE — Anesthesia Preprocedure Evaluation (Addendum)
Anesthesia Evaluation  Patient identified by MRN, date of birth, ID band Patient awake    Reviewed: Allergy & Precautions, H&P , NPO status , Patient's Chart, lab work & pertinent test results  Airway Mallampati: III  Neck ROM: Limited    Dental  (+) Teeth Intact   Pulmonary Current Smoker,          Cardiovascular     Neuro/Psych    GI/Hepatic GERD-  Medicated and Controlled,  Endo/Other  Hypothyroidism   Renal/GU      Musculoskeletal   Abdominal   Peds  Hematology   Anesthesia Other Findings In c-collar, s/p c-spine fx.  Neck ROM not evaluated   Reproductive/Obstetrics                          Anesthesia Physical Anesthesia Plan  ASA: III  Anesthesia Plan: General   Post-op Pain Management:    Induction: Intravenous  Airway Management Planned: Video Laryngoscope Planned and Oral ETT  Additional Equipment:   Intra-op Plan:   Post-operative Plan: Extubation in OR  Informed Consent: I have reviewed the patients History and Physical, chart, labs and discussed the procedure including the risks, benefits and alternatives for the proposed anesthesia with the patient or authorized representative who has indicated his/her understanding and acceptance.     Plan Discussed with: CRNA and Surgeon  Anesthesia Plan Comments:         Anesthesia Quick Evaluation
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
## 2011-02-19 NOTE — Progress Notes (Signed)
Filed Vitals:   02/19/11 1140 02/19/11 1217 02/19/11 1320 02/19/11 1415  BP: 113/65 113/65 112/75 116/74  Pulse: 80 90  86  Temp: 97.5 F (36.4 C) 97.5 F (36.4 C)    TempSrc:      Resp: 18 18 15 17   Height:   4\' 11"  (1.499 m)   Weight:   61.2 kg (134 lb 14.7 oz)   SpO2: 98% 93% 97% 98%    CBC  Basename 02/19/11 1130  WBC 18.7*  HGB 12.0  HCT 35.4*  PLT 236   BMET  Basename 02/19/11 1130  NA 139  K 3.5  CL 103  CO2 26  GLUCOSE 96  BUN 11  CREATININE 0.64  CALCIUM 8.7    Patient in recovery room, fairly comfortable, certainly more comfortable than prior to surgery. Wound clean and dry. Moving all 4 extremities well, with 5 over 5 strength. Foley to straight drainage.  He was felt that the final x-ray in surgery showed good reduction of the dislocation and therefore it was decided not to proceed with the posterior stabilization for now. We will plan on checking x-rays and possibly CT during the recuperative period to decide whether posterior stabilization will be necessary.  Plan: Once stabilized in recovery room to be transferred back to ICU. We'll allow head of bed to be up to 20-30. We'll leave n.p.o. for now except for meds.  [name], MD 02/19/2011, 6:06 PM

## 2011-02-19 NOTE — Anesthesia Procedure Notes (Signed)
Procedure Name: Intubation Date/Time: 02/19/2011 3:37 PM Performed by: Alanda Amass Pre-anesthesia Checklist: Patient identified, Emergency Drugs available, Timeout performed, Suction available and Patient being monitored Patient Re-evaluated:Patient Re-evaluated prior to inductionOxygen Delivery Method: Circle System Utilized Preoxygenation: Pre-oxygenation with 100% oxygen Intubation Type: IV induction Ventilation: Mask ventilation without difficulty Tube type: Oral Number of attempts: 1 Airway Equipment and Method: stylet and video-laryngoscopy Placement Confirmation: ETT inserted through vocal cords under direct vision,  positive ETCO2 and breath sounds checked- equal and bilateral Secured at: 21 cm Tube secured with: Tape Dental Injury: Teeth and Oropharynx as per pre-operative assessment  Comments: C-Collar on.  No extension or flexion during intubation

## 2011-02-19 NOTE — Progress Notes (Signed)
UR of chart complete.  
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
## 2011-02-19 NOTE — ED Notes (Signed)
Patient transported to CT 
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
## 2011-02-20 ENCOUNTER — Inpatient Hospital Stay (HOSPITAL_COMMUNITY): Payer: Medicaid Other

## 2011-02-20 ENCOUNTER — Encounter (HOSPITAL_COMMUNITY): Payer: Self-pay | Admitting: Neurosurgery

## 2011-02-20 DIAGNOSIS — Z720 Tobacco use: Secondary | ICD-10-CM | POA: Insufficient documentation

## 2011-02-20 DIAGNOSIS — E039 Hypothyroidism, unspecified: Secondary | ICD-10-CM | POA: Insufficient documentation

## 2011-02-20 DIAGNOSIS — F1290 Cannabis use, unspecified, uncomplicated: Secondary | ICD-10-CM | POA: Insufficient documentation

## 2011-02-20 DIAGNOSIS — Z8719 Personal history of other diseases of the digestive system: Secondary | ICD-10-CM | POA: Insufficient documentation

## 2011-02-20 LAB — ABO/RH: ABO/RH(D): A POS

## 2011-02-20 LAB — CBC
HCT: 32.3 % — ABNORMAL LOW (ref 36.0–46.0)
Hemoglobin: 10.6 g/dL — ABNORMAL LOW (ref 12.0–15.0)
MCHC: 32.8 g/dL (ref 30.0–36.0)
MCV: 88.7 fL (ref 78.0–100.0)

## 2011-02-20 LAB — BASIC METABOLIC PANEL
BUN: 4 mg/dL — ABNORMAL LOW (ref 6–23)
Creatinine, Ser: 0.56 mg/dL (ref 0.50–1.10)
GFR calc non Af Amer: >90 mL/min (ref >90)
Glucose, Bld: 136 mg/dL — ABNORMAL HIGH (ref 70–99)
Potassium: 3.4 mEq/L — ABNORMAL LOW (ref 3.5–5.1)

## 2011-02-20 LAB — TYPE AND SCREEN
ABO/RH(D): A POS
Antibody Screen: NEGATIVE

## 2011-02-20 MED ORDER — DIPHENHYDRAMINE HCL 50 MG/ML IJ SOLN: 12.5000 mg | Freq: Four times a day (QID) | INTRAMUSCULAR | Status: DC | PRN

## 2011-02-20 MED ORDER — FENTANYL 10 MCG/ML IV SOLN
INTRAVENOUS | Status: DC
  Administered 2011-02-20: 09:00:00 via INTRAVENOUS
  Administered 2011-02-20: 150 ug via INTRAVENOUS
  Administered 2011-02-21: 60 ug via INTRAVENOUS
  Administered 2011-02-21: 09:00:00 via INTRAVENOUS
  Administered 2011-02-21: 60 ug via INTRAVENOUS
  Administered 2011-02-21: 135 ug via INTRAVENOUS
  Administered 2011-02-21: 60 ug via INTRAVENOUS
  Administered 2011-02-22: 117.9 ug via INTRAVENOUS
  Administered 2011-02-22: 30 ug via INTRAVENOUS
  Filled 2011-02-20 (×2): qty 50

## 2011-02-20 MED ORDER — IPRATROPIUM-ALBUTEROL 18-103 MCG/ACT IN AERO: 2.0000 | INHALATION_SPRAY | RESPIRATORY_TRACT | Status: DC | PRN

## 2011-02-20 MED ORDER — DIPHENHYDRAMINE HCL 12.5 MG/5ML PO ELIX
12.5000 mg | ORAL_SOLUTION | Freq: Four times a day (QID) | ORAL | Status: DC | PRN
  Filled 2011-02-20: qty 5

## 2011-02-20 MED ORDER — LEVOTHYROXINE SODIUM 125 MCG PO TABS
125.0000 ug | ORAL_TABLET | Freq: Every day | ORAL | Status: DC
  Administered 2011-02-20 – 2011-02-23 (×4): 125 ug via ORAL
  Filled 2011-02-20 (×5): qty 1

## 2011-02-20 MED ORDER — ONDANSETRON HCL 4 MG/2ML IJ SOLN: 4.0000 mg | Freq: Four times a day (QID) | INTRAMUSCULAR | Status: DC | PRN

## 2011-02-20 MED ORDER — SODIUM CHLORIDE 0.9 % IJ SOLN: 9.0000 mL | INTRAMUSCULAR | Status: DC | PRN

## 2011-02-20 MED ORDER — POTASSIUM CITRATE ER 10 MEQ (1080 MG) PO TBCR
20.0000 meq | EXTENDED_RELEASE_TABLET | Freq: Three times a day (TID) | ORAL | Status: AC
  Administered 2011-02-20 – 2011-02-21 (×4): 20 meq via ORAL
  Filled 2011-02-20 (×4): qty 2

## 2011-02-20 MED ORDER — NALOXONE HCL 0.4 MG/ML IJ SOLN: 0.4000 mg | INTRAMUSCULAR | Status: DC | PRN

## 2011-02-20 MED ORDER — GABAPENTIN 100 MG PO CAPS
100.0000 mg | ORAL_CAPSULE | Freq: Two times a day (BID) | ORAL | Status: DC
  Administered 2011-02-20 – 2011-02-22 (×5): 100 mg via ORAL
  Filled 2011-02-20 (×6): qty 1

## 2011-02-20 MED ORDER — METHOCARBAMOL 100 MG/ML IJ SOLN
1000.0000 mg | Freq: Four times a day (QID) | INTRAMUSCULAR | Status: DC | PRN
  Filled 2011-02-20: qty 10

## 2011-02-20 MED ORDER — METHOCARBAMOL 100 MG/ML IJ SOLN
1000.0000 mg | Freq: Four times a day (QID) | INTRAVENOUS | Status: DC | PRN
  Filled 2011-02-20: qty 10

## 2011-02-20 NOTE — Progress Notes (Signed)
Stable. I D/W Dr. Newell Coral. He plans to check CT C-spine for evaluation of alignment. Patient examined and I agree with the assessment and plan  Violeta Gelinas, MD, MPH, FACS Pager: [phone]  02/20/2011 9:43 AM \
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
## 2011-02-21 LAB — CBC
HCT: 34.9 % — ABNORMAL LOW (ref 36.0–46.0)
Hemoglobin: 11.2 g/dL — ABNORMAL LOW (ref 12.0–15.0)
MCHC: 32.1 g/dL (ref 30.0–36.0)
RBC: 3.86 MIL/uL — ABNORMAL LOW (ref 3.87–5.11)
WBC: 10.3 10*3/uL (ref 4.0–10.5)

## 2011-02-21 LAB — BASIC METABOLIC PANEL
BUN: 4 mg/dL — ABNORMAL LOW (ref 6–23)
CO2: 32 mEq/L (ref 19–32)
Chloride: 101 mEq/L (ref 96–112)
GFR calc non Af Amer: >90 mL/min (ref >90)
Glucose, Bld: 113 mg/dL — ABNORMAL HIGH (ref 70–99)
Potassium: 3.9 mEq/L (ref 3.5–5.1)
Sodium: 139 mEq/L (ref 135–145)

## 2011-02-21 MED ORDER — FLUOXETINE HCL 20 MG PO CAPS: 20.0000 mg | ORAL_CAPSULE | Freq: Every day | ORAL | Status: DC

## 2011-02-21 MED ORDER — FLUOXETINE HCL 20 MG PO CAPS
20.0000 mg | ORAL_CAPSULE | Freq: Every day | ORAL | Status: DC
  Administered 2011-02-21 – 2011-02-22 (×2): 20 mg via ORAL
  Filled 2011-02-21 (×2): qty 1
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
## 2011-02-21 NOTE — Progress Notes (Signed)
Subjective:  Patient complaining of neck pain and muscular spasm. Continues on PCA fentanyl and Flexeril. CT scan yesterday of neck shows excellent reduction of fracture dislocation, with overall good alignment at this point, left C5 articular pillar in much better position. Taking clear liquids well.  Objective: Vital signs in last 24 hours: Filed Vitals:   02/21/11 0600 02/21/11 0700 02/21/11 0752 02/21/11 0800  BP: 100/69 108/72  96/82  Pulse: 76 75  89  Temp:    97.4 F (36.3 C)  TempSrc:    Oral  Resp: 16 17 21 21   Height:      Weight:      SpO2: 99% 98% 94% 94%    Intake/Output from previous day: 02/12 0701 - 02/13 0700 In: 1750.8 [P.O.:200; I.V.:1550.8] Out: 3170 [Urine:3170] Intake/Output this shift: Total I/O In: 290 [P.O.:240; I.V.:50] Out: 125 [Urine:125]  Physical Exam:  Wound is clean and dry. Moving all extremities well.  CBC  Basename 02/21/11 0340 02/20/11 0508  WBC 10.3 7.2  HGB 11.2* 10.6*  HCT 34.9* 32.3*  PLT 179 180   BMET  Basename 02/21/11 0340 02/20/11 0508  NA 139 139  K 3.9 3.4*  CL 101 105  CO2 32 30  GLUCOSE 113* 136*  BUN 4* 4*  CREATININE 0.61 0.56  CALCIUM 9.3 8.7    Assessment/Plan: Patient doing well other than for discomfort. Will be able to advance diet and activity, orders written. We'll DC Foley. We'll plan on checking upright lateral C-spine x-ray after up and about. Hopefully we'll be able to transition from PCA to oral analgesics.   [name], MD 02/21/2011, 8:45 AM
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
## 2011-02-21 NOTE — Progress Notes (Signed)
Clinical Social Worker completed the psychosocial assessment which can be found in the shadow chart.  Patient states that she lives at home with her 41 year old son and 37 year old daughter.  Patient is currently married, however patient husband lives in a different home.  Patient states "we just get along better when we don't live together."  Patient plans to return home with support from family.  Patient inquired about possible financial assistance at discharge - CSW spoke with patient about the [employer] the [employer] program through [employer] and left a message for Artist.  Patient expressed concerns about her home Prozac and not being able to take it during this hospital stay.  CSW notified MD of patient concerns and the possibility of restarting the medication.  Clinical Social Worker has completed SBIRT with patient at bedside.  Patient states that she does not drink alcohol at this time.  No resources needed.  Clinical Social Worker will remain available for emotional support as needed.  [address] [place], [place] [phone]

## 2011-02-22 ENCOUNTER — Inpatient Hospital Stay (HOSPITAL_COMMUNITY): Payer: Medicaid Other

## 2011-02-22 MED ORDER — ALPRAZOLAM 0.5 MG PO TABS
0.5000 mg | ORAL_TABLET | Freq: Once | ORAL | Status: AC
  Administered 2011-02-22: 0.5 mg via ORAL
  Filled 2011-02-22: qty 1

## 2011-02-22 MED ORDER — FLUOXETINE HCL 20 MG PO CAPS
60.0000 mg | ORAL_CAPSULE | Freq: Every day | ORAL | Status: DC
  Administered 2011-02-23: 60 mg via ORAL
  Filled 2011-02-22: qty 3

## 2011-02-22 MED ORDER — GABAPENTIN 100 MG PO CAPS
100.0000 mg | ORAL_CAPSULE | Freq: Three times a day (TID) | ORAL | Status: DC
  Administered 2011-02-22 – 2011-02-23 (×4): 100 mg via ORAL
  Filled 2011-02-22 (×5): qty 1

## 2011-02-22 MED ORDER — TRAMADOL HCL 50 MG PO TABS
100.0000 mg | ORAL_TABLET | Freq: Four times a day (QID) | ORAL | Status: DC
  Administered 2011-02-22: 100 mg via ORAL
  Filled 2011-02-22 (×8): qty 2

## 2011-02-22 MED ORDER — FENTANYL CITRATE 0.05 MG/ML IJ SOLN
25.0000 ug | INTRAMUSCULAR | Status: DC | PRN
  Administered 2011-02-22 – 2011-02-23 (×3): 25 ug via INTRAVENOUS
  Filled 2011-02-22 (×3): qty 2

## 2011-02-22 NOTE — Progress Notes (Signed)
Physical Therapy Treatment Patient Details Name: [name] MRN: [id number] DOB: 27-Jan-1970 Today's Date: 02/22/2011  PT Assessment/Plan  PT - Assessment/Plan Comments on Treatment Session: Pt progressing very well with mobility & PT goals.  Pt's husband present entire session.  Co-treat with OT.   Pt & husband were educated on cervical precautions & brace care.  PT Plan: Discharge plan remains appropriate PT Frequency: Min 5X/week Follow Up Recommendations: No PT follow up Equipment Recommended: None recommended by PT PT Goals  Acute Rehab PT Goals PT Goal: Supine/Side to Sit - Progress: Met PT Goal: Sit to Stand - Progress: Met PT Goal: Stand to Sit - Progress: Met PT Goal: Ambulate - Progress: Met  PT Treatment Precautions/Restrictions  Precautions Precautions: Other (comment) (cervical) Required Braces or Orthoses: Yes Cervical Brace: Hard collar (at all times) Mobility (including Balance) Bed Mobility Rolling Right: 6: Modified independent (Device/Increase time) Right Sidelying to Sit: 6: Modified independent (Device/Increase time);HOB flat Transfers Sit to Stand: 5: Supervision;From bed;From toilet;With upper extremity assist Sit to Stand Details (indicate cue type and reason): Supervision for safety Stand to Sit: 5: Supervision;To toilet;To chair/3-in-1;With upper extremity assist Stand to Sit Details: Supervision for safety.   Ambulation/Gait Ambulation/Gait Assistance: 5: Supervision Ambulation/Gait Assistance Details (indicate cue type and reason): Pt reaching for furniture/wall ocassionally for support- pt states she doesnt feel unsteady, that shes just nervous.   Ambulation Distance (Feet): 175 Feet Assistive device: None Gait Pattern: Step-through pattern;Decreased step length - right;Decreased step length - left;Decreased stride length Stairs: No Wheelchair Mobility Wheelchair Mobility: No  Posture/Postural Control Posture/Postural Control: No significant  limitations Balance Balance Assessed: No Exercise    End of Session PT - End of Session Equipment Utilized During Treatment: Gait belt;Cervical collar Activity Tolerance: Patient tolerated treatment well Patient left: in chair;with call bell in reach;with family/visitor present General Behavior During Session: [facility] [place] for tasks performed Cognition: [facility] for tasks performed  Lara Mulch 02/22/2011, 2:03 PM [phone]
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
## 2011-02-22 NOTE — Progress Notes (Signed)
Subjective:  Patient sitting up in chair at bedside, more comfortable today. She has ambulated. She says that most of her discomfort is on the left side of the neck, and I explained to her that the worst part of her fracture was on the left side.  Objective: Vital signs in last 24 hours: Filed Vitals:   02/22/11 0759 02/22/11 0800 02/22/11 0900 02/22/11 1000  BP:  112/75 112/65 97/63  Pulse:  82 90 78  Temp:      TempSrc:      Resp: 15 14 18 15   Height:      Weight:      SpO2: 95% 96% 96% 93%    Intake/Output from previous day: 02/13 0701 - 02/14 0700 In: 1830 [P.O.:1380; I.V.:450] Out: 1950 [Urine:1950] Intake/Output this shift: Total I/O In: 280 [P.O.:240; I.V.:40] Out: -   Physical Exam:  Incision well-healed. Moving all extremities well.  Assessment/Plan: Patient making good progress. We'll check upright lateral cervical spine x-ray. Agree with transfer to 3000.   [name], MD 02/22/2011, 11:01 AM
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
## 2011-02-22 NOTE — Progress Notes (Signed)
Clinical Social Worker met with patient to offer patient  financial resources.  CSW provided a list of [employer] agencies in [place] for [employer] and information on the [employer] program at the [employer] of [facility].  Patient was appreciative of resource information given by CSW.  Clinical Social Worker will sign off for now as social work intervention is no longer needed. Please consult [place] again if new need arises.   Arnette Norris, MSW Intern  [place], [place] [phone]

## 2011-02-22 NOTE — Progress Notes (Signed)
Patient examined and I agree with the assessment and plan  Violeta Gelinas, MD, MPH, FACS Pager: [phone]  02/22/2011 1:41 PM

## 2011-02-22 NOTE — Progress Notes (Signed)
UR of chart completed.  

## 2011-02-23 MED ORDER — ALPRAZOLAM 0.5 MG PO TABS
0.5000 mg | ORAL_TABLET | Freq: Three times a day (TID) | ORAL | Status: DC | PRN
  Administered 2011-02-23: 0.5 mg via ORAL
  Filled 2011-02-23: qty 1

## 2011-02-23 MED ORDER — OXYCODONE-ACETAMINOPHEN 5-325 MG PO TABS: 1.0000 | ORAL_TABLET | ORAL | Status: AC | PRN

## 2011-02-23 MED ORDER — PNEUMOCOCCAL VAC POLYVALENT 25 MCG/0.5ML IJ INJ
0.5000 mL | INJECTION | Freq: Once | INTRAMUSCULAR | Status: AC
  Administered 2011-02-23: 0.5 mL via INTRAMUSCULAR
  Filled 2011-02-23: qty 0.5

## 2011-02-23 MED ORDER — GABAPENTIN 100 MG PO CAPS: 100.0000 mg | ORAL_CAPSULE | Freq: Three times a day (TID) | ORAL | Status: DC

## 2011-02-23 MED ORDER — CYCLOBENZAPRINE HCL 10 MG PO TABS: 10.0000 mg | ORAL_TABLET | Freq: Three times a day (TID) | ORAL | Status: AC | PRN

## 2011-02-23 MED ORDER — IPRATROPIUM-ALBUTEROL 18-103 MCG/ACT IN AERO: 2.0000 | INHALATION_SPRAY | RESPIRATORY_TRACT | Status: DC | PRN

## 2011-02-23 MED ORDER — MAGNESIUM HYDROXIDE 400 MG/5ML PO SUSP: 30.0000 mL | Freq: Every day | ORAL | Status: AC | PRN

## 2011-02-23 MED ORDER — TRAMADOL HCL 50 MG PO TABS: 100.0000 mg | ORAL_TABLET | Freq: Four times a day (QID) | ORAL | Status: AC

## 2011-02-23 NOTE — Progress Notes (Signed)
Physical Therapy Treatment Patient Details Name: [name] MRN: [id number] DOB: 08/16/1970 Today's Date: 02/23/2011  PT Assessment/Plan  PT - Assessment/Plan Comments on Treatment Session: Pt progressing well. Pt is at a supervision level for all mobility, although pt nervous to d/c home. Discussed RW with patient for security upon d/c. Discussed cervical precautions and safety upon d/c PT Plan: Discharge plan remains appropriate PT Frequency: Min 5X/week Follow Up Recommendations: No PT follow up Equipment Recommended: None recommended by OT;None recommended by PT PT Goals  Acute Rehab PT Goals PT Goal Formulation: With patient PT Goal: Sit to Stand - Progress: Progressing toward goal PT Goal: Stand to Sit - Progress: Progressing toward goal PT Goal: Ambulate - Progress: Met PT Goal: Up/Down Stairs - Progress: Met Additional Goals PT Goal: Additional Goal #1 - Progress: Met  PT Treatment Precautions/Restrictions  Precautions Precautions: Other (comment) (cervical) Precaution Comments: cervical  Required Braces or Orthoses: Yes Cervical Brace: Hard collar Restrictions Weight Bearing Restrictions: Yes Other Position/Activity Restrictions: limit reaching > 90 degrees (cervical precautions) Mobility (including Balance) Bed Mobility Bed Mobility: No Transfers Transfers: Yes Sit to Stand: 5: Supervision;From chair/3-in-1 Sit to Stand Details (indicate cue type and reason): VC for hand placement for safety Stand to Sit: 5: Supervision Stand to Sit Details: VC for hand placement for safety and cueing to maintain safety with neck precautions Ambulation/Gait Ambulation/Gait: Yes Ambulation/Gait Assistance: 5: Supervision Ambulation/Gait Assistance Details (indicate cue type and reason): No physical assist needed, although pt reaching out for assistance of walls when available. Pt able to ambualte without AD or holding onto things. Discussed RW for stability and security upon d/c  home. Ambulation Distance (Feet): 150 Feet Assistive device: None Gait Pattern: Step-through pattern;Decreased step length - right;Decreased step length - left;Decreased stride length Gait velocity: Decreased gait speed Stairs: Yes Stairs Assistance: 5: Supervision Stairs Assistance Details (indicate cue type and reason): VC for sequencing on stairs and technique for safety. Pt nervous but able to complete without physical assist Stair Management Technique: Two rails;Step to pattern;Forwards Number of Stairs: 10  Wheelchair Mobility Wheelchair Mobility: No    End of Session PT - End of Session Equipment Utilized During Treatment: Gait belt;Cervical collar Activity Tolerance: Patient tolerated treatment well Patient left: in chair;with call bell in reach Nurse Communication: Mobility status for transfers;Mobility status for ambulation General Behavior During Session: [facility] for tasks performed Cognition: [facility] for tasks performed  Milana Kidney 02/23/2011, 8:41 AM  02/23/2011 Milana Kidney DPT PAGER: [phone] OFFICE: [phone]
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
## 2011-02-23 NOTE — Progress Notes (Signed)
Subjective:  Patient's fairly comfortable. He is ambulating a limited amount. In [place] cervical collar.   Objective: Vital signs in last 24 hours: Filed Vitals:   02/22/11 2100 02/23/11 0000 02/23/11 0400 02/23/11 0700  BP:  95/73 101/60 105/59  Pulse:  78 72 93  Temp:  98.1 F (36.7 C) 98.5 F (36.9 C)   TempSrc:  Oral Oral   Resp:  12 14 14   Height:      Weight:      SpO2: 90% 96% 94% 95%    Intake/Output from previous day: 02/14 0701 - 02/15 0700 In: 1480 [P.O.:1440; I.V.:40] Out: 1230 [Urine:1230] Intake/Output this shift:    Physical Exam:  Incision clean and dry. Moving all extremities well.  Studies/Results: Dg Cervical Spine 1 View  02/22/2011  *RADIOLOGY REPORT*  Clinical Data: 41 year old female status post spine surgery. Traumatic C5-C6 fracture dislocation.  DG CERVICAL SPINE - 1 VIEW  Comparison: 02/20/2011.  Findings: Sequelae of C4-C5 and C5-C6 ACDF.  Prevertebral soft tissue swelling up to 29 mm at the C5 level.  Stable vertebral height and alignment compared to the postoperative CT. Cervicothoracic junction alignment is within normal limits.  IMPRESSION: Stable postoperative appearance of the cervical spine. Prevertebral soft tissue swelling up to 29 mm at the C5 level.  Original Report Authenticated By: Harley Hallmark, M.D.    Assessment/Plan: X-ray yesterday shows good alignment, mild prevertebral swelling. Overall appearance is good. Instructed to increase ambulation to 4-5 times per day in the ICU. Discussed with Shawn Rayburn from trauma surgery discontinuing monitor and IV narcotics. We'll need to followup with me in the office in about 3 weeks.   [name], MD 02/23/2011, 7:37 AM

## 2011-02-23 NOTE — Progress Notes (Signed)
Hopefully resuming home Xanax will help anxiety Patient examined and I agree with the assessment and plan  Violeta Gelinas, MD, MPH, FACS Pager: [phone]  02/23/2011 8:49 AM

## 2011-02-23 NOTE — Discharge Summary (Signed)
Brooke Hasley, MD, MPH, FACS Pager: [phone]  

## 2011-02-23 NOTE — Progress Notes (Signed)
HHPT/OT and HHAide all arranged with [facility]. Address in Epic is correct but the PHONE NUMBER IS NOT CORRECT. Mother's Dahlia Bailiff) phone number is [phone]. She lives down the road from patient. Patient has no phone currently.

## 2011-02-23 NOTE — Progress Notes (Signed)
VSS, Pulse 92, BP 116/73. PIV removed.  Patient ambulating well. Discharge instructions given and understood per patient, prescriptions given to patient. Discharge education taught and understood per patient. Patient discharged with medical equipment per MD. Patient discharged home with mother at 1500.

## 2011-02-23 NOTE — Progress Notes (Addendum)
Occupational Therapy Treatment Patient Details Name: [name] MRN: [id number] DOB: 10/17/1970 Today's Date: 02/23/2011  OT Assessment/Plan OT Assessment/Plan Comments on Treatment Session: Pt making great progress this session. Pt is at adequate level to d/c home safely. Pt with no further acute OT needs OT Plan: Discharge plan remains appropriate OT Frequency: Min 2X/week Follow Up Recommendations: No OT follow up; TUB BENCH Equipment Recommended: None recommended by OT;None recommended by PT OT Goals Acute Rehab OT Goals OT Goal Formulation: With patient Time For Goal Achievement: 7 days Miscellaneous OT Goals Miscellaneous OT Goal #1: Pt will complete tub transfer with family (A) PRN MOD I OT Goal: Miscellaneous Goal #1 - Progress: Met  OT Treatment Precautions/Restrictions  Precautions Precautions: Other (comment) Precaution Comments: cervical  Required Braces or Orthoses: Yes Cervical Brace: Hard collar Restrictions Weight Bearing Restrictions: Yes Other Position/Activity Restrictions: limit reaching > 90 degrees (cervical precautions)   ADL ADL Eating/Feeding: Performed;Modified independent Where Assessed - Eating/Feeding: Chair Ambulation Related to ADLs: Pt ambulating S level in room  ADL Comments: Pt complete tub transfer simulation with tub bench.  Mobility  Bed Mobility Bed Mobility: No Transfers Transfers: Yes Sit to Stand: 6: Modified independent (Device/Increase time);With upper extremity assist;From chair/3-in-1;With armrests Sit to Stand Details (indicate cue type and reason): VC for hand placement for safety Stand to Sit: 6: Modified independent (Device/Increase time);To chair/3-in-1;With upper extremity assist;With armrests Stand to Sit Details: VC for hand placement for safety and cueing to maintain safety with neck precautions Exercises    End of Session OT - End of Session Equipment Utilized During Treatment: Gait belt;Cervical  collar Activity Tolerance: Patient tolerated treatment well Patient left: in chair;with call bell in reach Nurse Communication: Mobility status for transfers;Mobility status for ambulation General Behavior During Session: [facility] for tasks performed Cognition: [facility] for tasks performed  Lucile Shutters  02/23/2011, 9:01 AM Pager: [phone]
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
## 2011-03-08 IMAGING — CR DG FOOT COMPLETE 3+V*R*
3 series · 3 of 3 positions shown · non-contrast
Comparison: None

CLINICAL DATA: Tripped and fell with pain

RIGHT FOOT COMPLETE - 3+ VIEW

[view not recorded (1 of 3)]
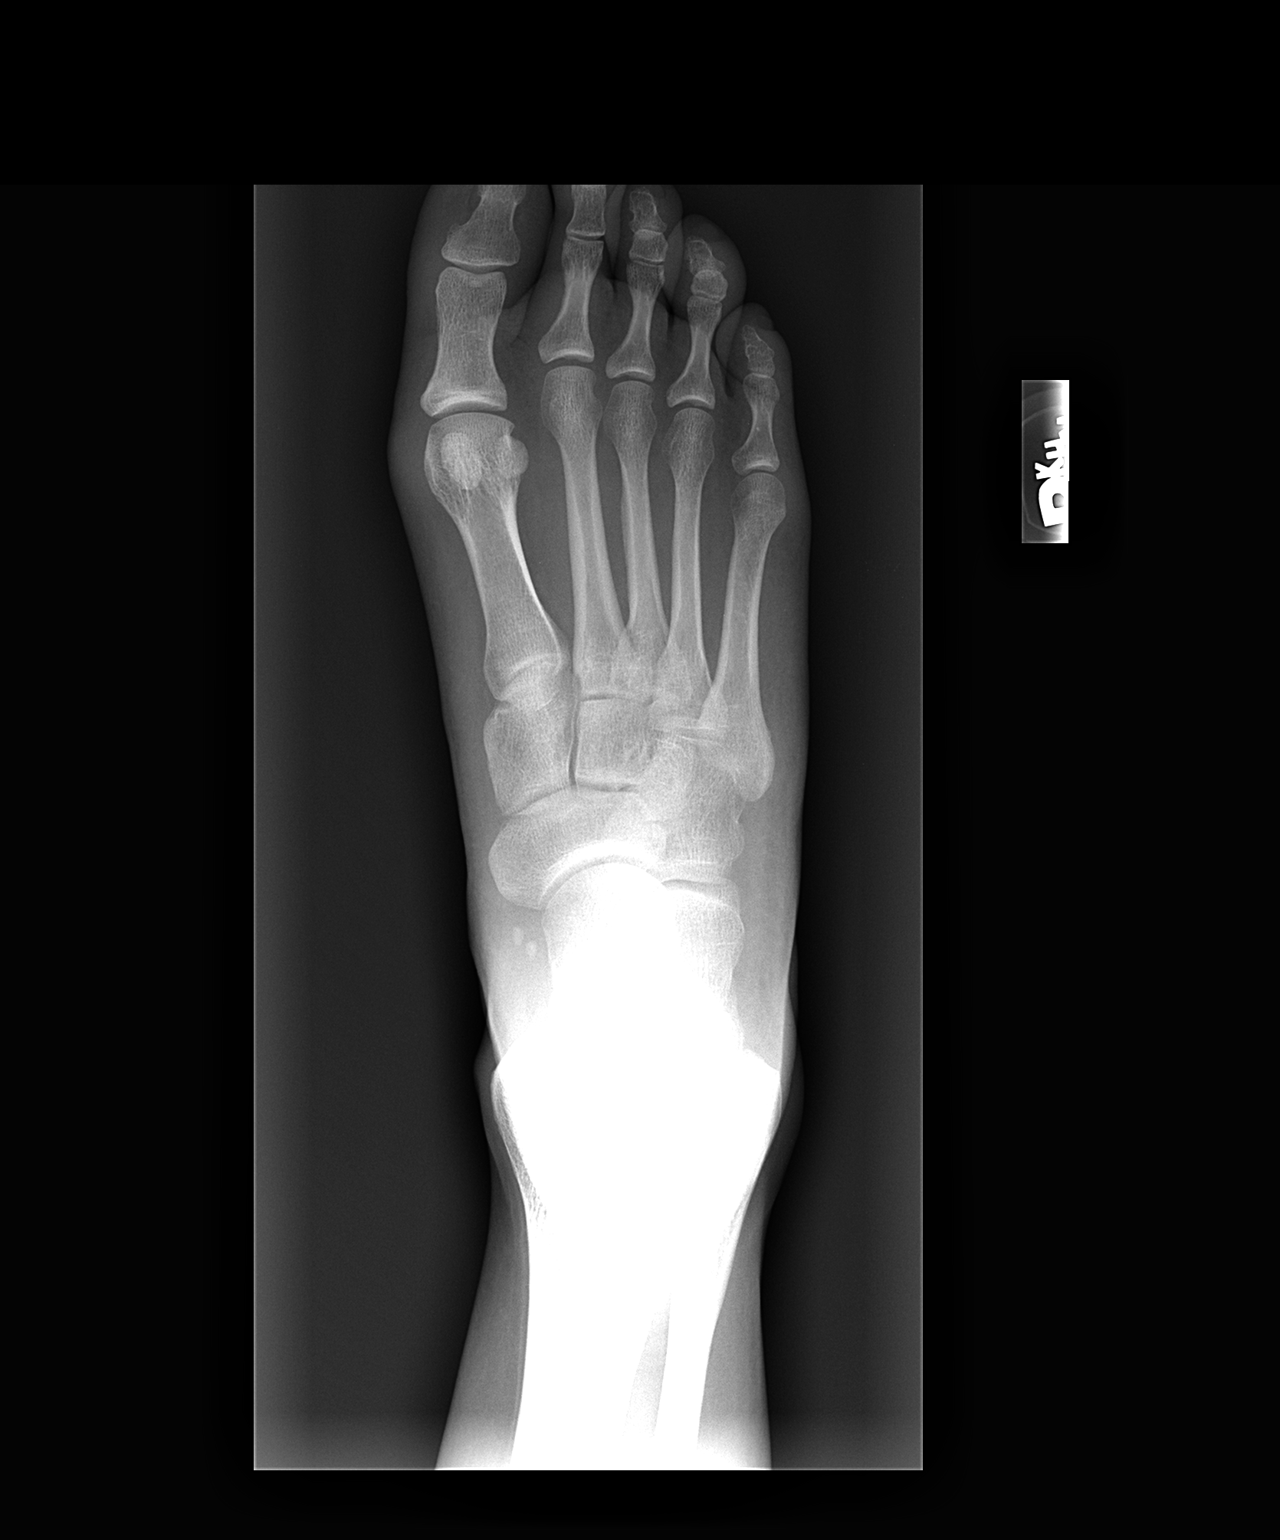

[view not recorded (2 of 3)]
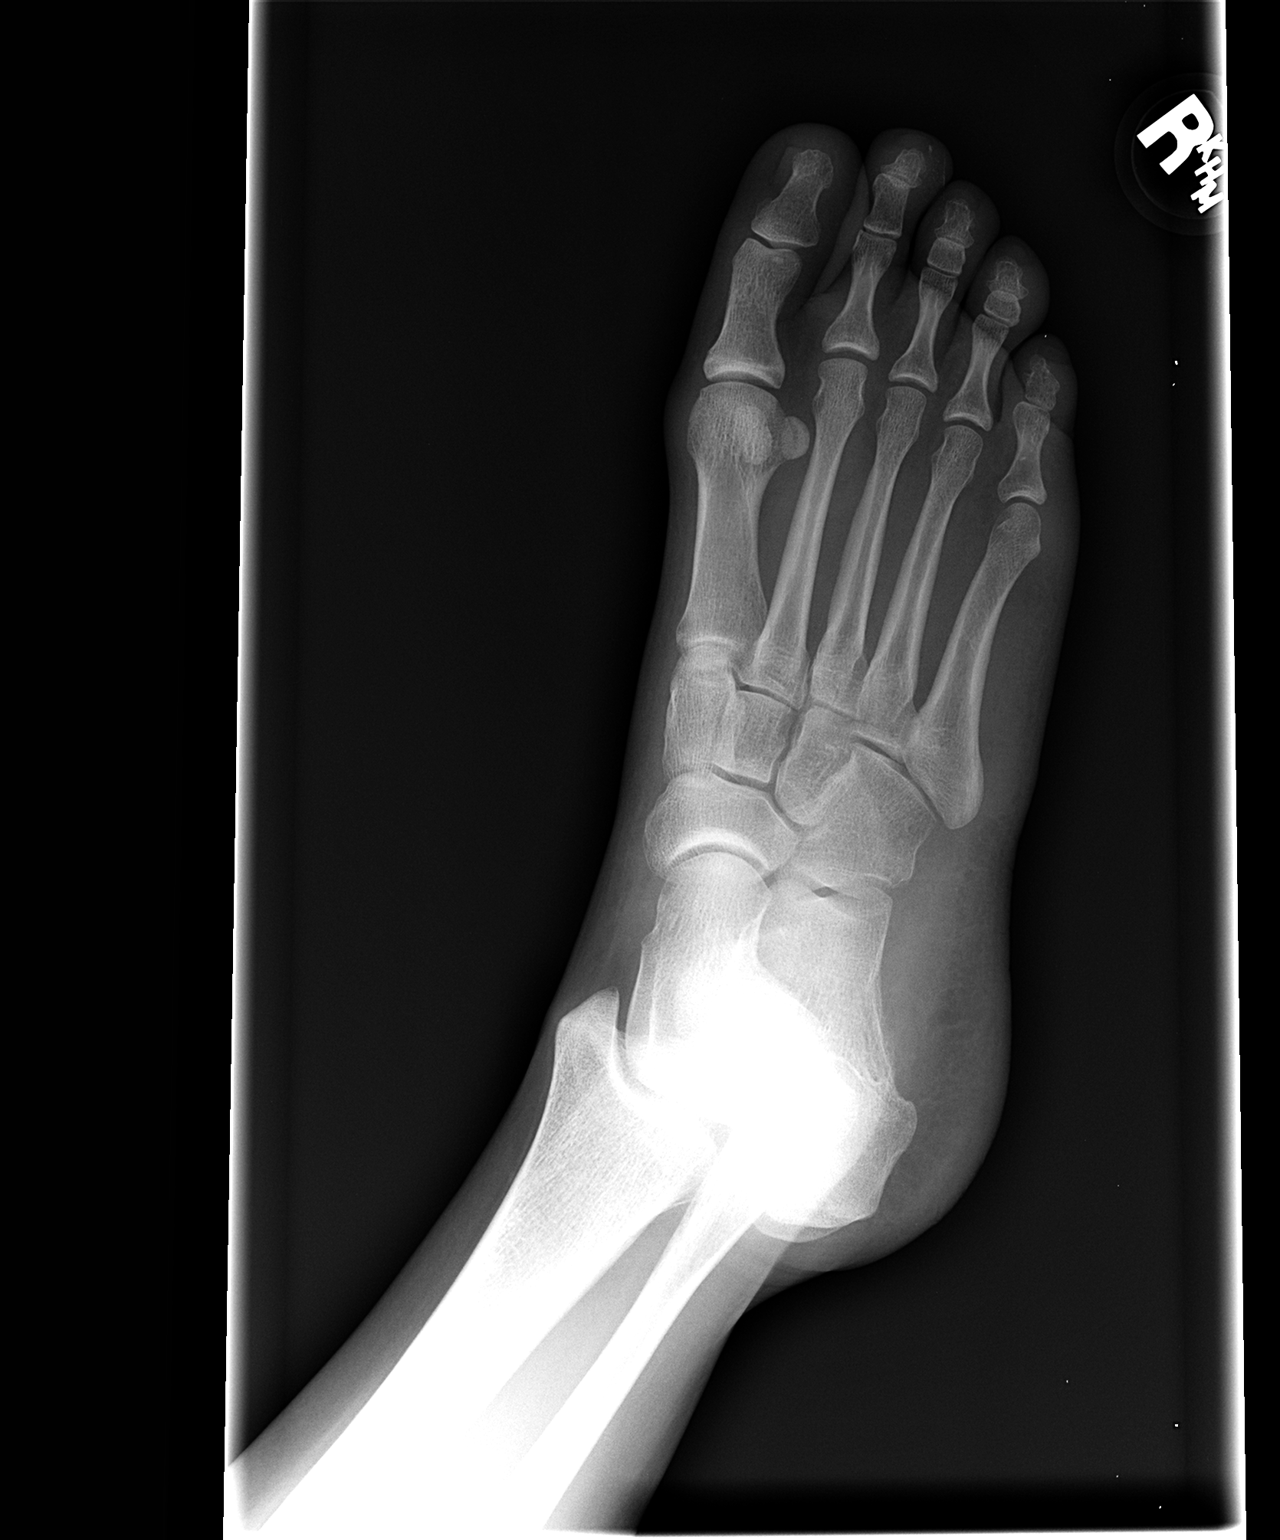

[view not recorded (3 of 3)]
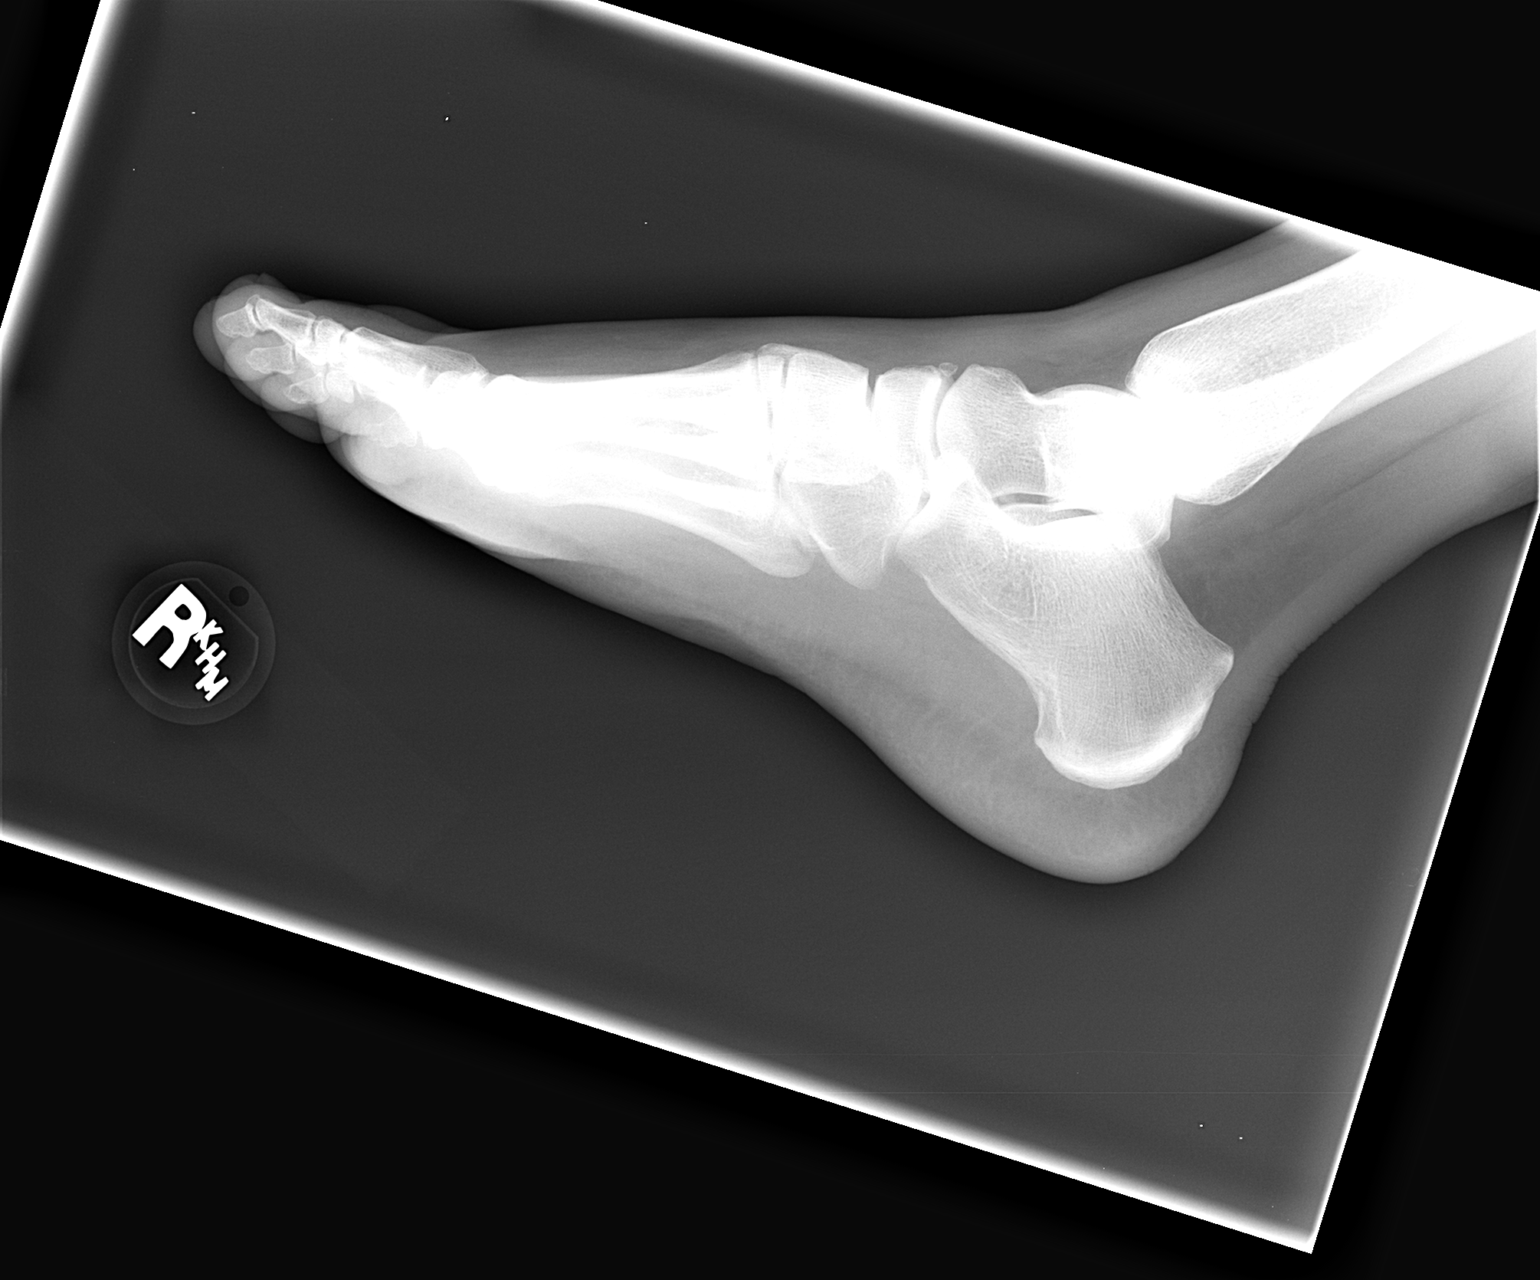

[3 of 3 positions shown; findings below may reference images not displayed]

FINDINGS: No acute fracture is seen.  Tarsal - metatarsal alignment
is normal.  Joint spaces appear normal.
IMPRESSION: Negative right foot.

## 2011-04-26 ENCOUNTER — Other Ambulatory Visit: Payer: Self-pay | Admitting: Neurosurgery

## 2011-04-26 DIAGNOSIS — M542 Cervicalgia: Secondary | ICD-10-CM

## 2011-04-28 ENCOUNTER — Ambulatory Visit
Admission: RE | Admit: 2011-04-28 | Discharge: 2011-04-28 | Disposition: A | Discharge status: Home / Self Care | Payer: No Typology Code available for payment source | Source: Ambulatory Visit | Attending: Neurosurgery | Admitting: Neurosurgery

## 2011-04-28 DIAGNOSIS — M542 Cervicalgia: Secondary | ICD-10-CM

## 2011-05-02 ENCOUNTER — Other Ambulatory Visit: Payer: Self-pay | Admitting: Neurosurgery

## 2011-05-07 ENCOUNTER — Encounter (HOSPITAL_COMMUNITY): Payer: Self-pay

## 2011-05-07 ENCOUNTER — Encounter (HOSPITAL_COMMUNITY)
Admission: RE | Admit: 2011-05-07 | Discharge: 2011-05-07 | Disposition: A | Discharge status: Home / Self Care | Payer: Medicaid Other | Source: Ambulatory Visit | Attending: Neurosurgery | Admitting: Neurosurgery

## 2011-05-07 HISTORY — DX: Headache: R51

## 2011-05-07 LAB — CBC
HCT: 35.2 % — ABNORMAL LOW (ref 36.0–46.0)
Hemoglobin: 11.5 g/dL — ABNORMAL LOW (ref 12.0–15.0)
MCH: 29.3 pg (ref 26.0–34.0)
MCHC: 32.7 g/dL (ref 30.0–36.0)
MCV: 89.8 fL (ref 78.0–100.0)
Platelets: 240 10*3/uL (ref 150–400)
RBC: 3.92 MIL/uL (ref 3.87–5.11)
RDW: 13.4 % (ref 11.5–15.5)
WBC: 6.6 10*3/uL (ref 4.0–10.5)

## 2011-05-07 LAB — BASIC METABOLIC PANEL
BUN: 15 mg/dL (ref 6–23)
CO2: 28 mEq/L (ref 19–32)
Calcium: 8.9 mg/dL (ref 8.4–10.5)
Chloride: 105 mEq/L (ref 96–112)
Creatinine, Ser: 0.71 mg/dL (ref 0.50–1.10)
GFR calc Af Amer: >90 mL/min (ref >90)
GFR calc non Af Amer: >90 mL/min (ref >90)
Glucose, Bld: 92 mg/dL (ref 70–99)
Potassium: 4.2 mEq/L (ref 3.5–5.1)
Sodium: 142 mEq/L (ref 135–145)

## 2011-05-07 LAB — SURGICAL PCR SCREEN
MRSA, PCR: POSITIVE — AB
Staphylococcus aureus: POSITIVE — AB

## 2011-05-07 NOTE — Pre-Procedure Instructions (Signed)
20 [name]  05/07/2011   Your procedure is scheduled on:  05/14/11  Report to [facility] at 820 AM.  Call this number if you have problems the morning of surgery: [phone]   Remember:   Do not eat food:After Midnight.  May have clear liquids: up to 4 Hours before arrival.  Clear liquids include soda, tea, black coffee, apple or grape juice, broth.  Take these medicines the morning of surgery with A SIP OF WATER: all inhalers,xanax,prozac,neurontin,synthroid,prilosec,zantac   Do not wear jewelry, make-up or nail polish.  Do not wear lotions, powders, or perfumes. You may wear deodorant.  Do not shave 48 hours prior to surgery.  Do not bring valuables to the hospital.  Contacts, dentures or bridgework may not be worn into surgery.  Leave suitcase in the car. After surgery it may be brought to your room.  For patients admitted to the hospital, checkout time is 11:00 AM the day of discharge.   Patients discharged the day of surgery will not be allowed to drive home.  Name and phone number of your driver: family  Special Instructions: CHG Shower Use Special Wash: 1/2 bottle night before surgery and 1/2 bottle morning of surgery.   Please read over the following fact sheets that you were given: Pain Booklet, Coughing and Deep Breathing, MRSA Information and Surgical Site Infection Prevention

## 2011-05-11 NOTE — Progress Notes (Signed)
Patient's mother will inform her to arrive 0530 for surgery.

## 2011-05-13 MED ORDER — CEFAZOLIN SODIUM 1-5 GM-% IV SOLN
1.0000 g | INTRAVENOUS | Status: AC
  Administered 2011-05-14: 1 g via INTRAVENOUS
  Filled 2011-05-13: qty 50

## 2011-05-14 ENCOUNTER — Ambulatory Visit (HOSPITAL_COMMUNITY): Payer: Medicaid Other

## 2011-05-14 ENCOUNTER — Encounter (HOSPITAL_COMMUNITY): Payer: Self-pay | Admitting: Anesthesiology

## 2011-05-14 ENCOUNTER — Inpatient Hospital Stay (HOSPITAL_COMMUNITY)
Admission: RE | Admit: 2011-05-14 | Discharge: 2011-05-16 | DRG: 473 | Disposition: A | Discharge status: Home / Self Care | Payer: Medicaid Other | Source: Ambulatory Visit | Attending: Neurosurgery | Admitting: Neurosurgery

## 2011-05-14 ENCOUNTER — Encounter (HOSPITAL_COMMUNITY)
Admission: RE | Disposition: A | Discharge status: Home / Self Care | Payer: Self-pay | Source: Ambulatory Visit | Attending: Neurosurgery

## 2011-05-14 ENCOUNTER — Ambulatory Visit (HOSPITAL_COMMUNITY): Payer: Medicaid Other | Admitting: Anesthesiology

## 2011-05-14 DIAGNOSIS — F1210 Cannabis abuse, uncomplicated: Secondary | ICD-10-CM | POA: Diagnosis present

## 2011-05-14 DIAGNOSIS — S12400A Unspecified displaced fracture of fifth cervical vertebra, initial encounter for closed fracture: Secondary | ICD-10-CM | POA: Diagnosis present

## 2011-05-14 DIAGNOSIS — S12300A Unspecified displaced fracture of fourth cervical vertebra, initial encounter for closed fracture: Principal | ICD-10-CM | POA: Diagnosis present

## 2011-05-14 DIAGNOSIS — E039 Hypothyroidism, unspecified: Secondary | ICD-10-CM | POA: Diagnosis present

## 2011-05-14 DIAGNOSIS — F17200 Nicotine dependence, unspecified, uncomplicated: Secondary | ICD-10-CM | POA: Diagnosis present

## 2011-05-14 DIAGNOSIS — Z888 Allergy status to other drugs, medicaments and biological substances status: Secondary | ICD-10-CM

## 2011-05-14 DIAGNOSIS — Z79899 Other long term (current) drug therapy: Secondary | ICD-10-CM

## 2011-05-14 DIAGNOSIS — K219 Gastro-esophageal reflux disease without esophagitis: Secondary | ICD-10-CM | POA: Diagnosis present

## 2011-05-14 HISTORY — PX: POSTERIOR CERVICAL FUSION/FORAMINOTOMY: SHX5038

## 2011-05-14 SURGERY — POSTERIOR CERVICAL FUSION/FORAMINOTOMY LEVEL 3
Anesthesia: General

## 2011-05-14 MED ORDER — LIDOCAINE-EPINEPHRINE 1 %-1:100000 IJ SOLN
INTRAMUSCULAR | Status: DC | PRN
  Administered 2011-05-14: 10 mL

## 2011-05-14 MED ORDER — SODIUM CHLORIDE 0.9 % IJ SOLN
3.0000 mL | Freq: Two times a day (BID) | INTRAMUSCULAR | Status: DC
  Administered 2011-05-14 – 2011-05-15 (×3): 3 mL via INTRAVENOUS

## 2011-05-14 MED ORDER — HYDROMORPHONE HCL PF 1 MG/ML IJ SOLN
0.2500 mg | INTRAMUSCULAR | Status: DC | PRN
  Administered 2011-05-14: 0.5 mg via INTRAVENOUS
  Administered 2011-05-14 (×2): 0.25 mg via INTRAVENOUS

## 2011-05-14 MED ORDER — HETASTARCH-ELECTROLYTES 6 % IV SOLN
INTRAVENOUS | Status: DC | PRN
  Administered 2011-05-14: 08:00:00 via INTRAVENOUS

## 2011-05-14 MED ORDER — FLUOXETINE HCL 20 MG PO TABS
60.0000 mg | ORAL_TABLET | Freq: Every day | ORAL | Status: DC
  Administered 2011-05-14 – 2011-05-16 (×3): 60 mg via ORAL
  Filled 2011-05-14 (×3): qty 3

## 2011-05-14 MED ORDER — KETOROLAC TROMETHAMINE 30 MG/ML IJ SOLN
30.0000 mg | Freq: Four times a day (QID) | INTRAMUSCULAR | Status: AC
  Administered 2011-05-14 – 2011-05-16 (×8): 30 mg via INTRAVENOUS
  Filled 2011-05-14 (×10): qty 1

## 2011-05-14 MED ORDER — PHENOL 1.4 % MT LIQD: 1.0000 | OROMUCOSAL | Status: DC | PRN

## 2011-05-14 MED ORDER — HEMOSTATIC AGENTS (NO CHARGE) OPTIME
TOPICAL | Status: DC | PRN
  Administered 2011-05-14: 1 via TOPICAL

## 2011-05-14 MED ORDER — LIDOCAINE HCL (CARDIAC) 20 MG/ML IV SOLN
INTRAVENOUS | Status: DC | PRN
  Administered 2011-05-14: 70 mg via INTRAVENOUS

## 2011-05-14 MED ORDER — ONDANSETRON HCL 4 MG/2ML IJ SOLN
INTRAMUSCULAR | Status: DC | PRN
  Administered 2011-05-14: 4 mg via INTRAVENOUS

## 2011-05-14 MED ORDER — MAGNESIUM HYDROXIDE 400 MG/5ML PO SUSP: 30.0000 mL | Freq: Every day | ORAL | Status: DC | PRN

## 2011-05-14 MED ORDER — PROPOFOL 10 MG/ML IV EMUL
INTRAVENOUS | Status: DC | PRN
  Administered 2011-05-14: 200 mg via INTRAVENOUS

## 2011-05-14 MED ORDER — LEVOTHYROXINE SODIUM 137 MCG PO TABS
137.0000 ug | ORAL_TABLET | Freq: Every day | ORAL | Status: DC
  Administered 2011-05-15 – 2011-05-16 (×2): 137 ug via ORAL
  Filled 2011-05-14 (×3): qty 1

## 2011-05-14 MED ORDER — DIAZEPAM 5 MG/ML IJ SOLN
INTRAMUSCULAR | Status: AC
  Administered 2011-05-14: 2.5 mg via INTRAVENOUS
  Filled 2011-05-14: qty 2

## 2011-05-14 MED ORDER — OXYCODONE-ACETAMINOPHEN 5-325 MG PO TABS
1.0000 | ORAL_TABLET | ORAL | Status: DC | PRN
  Administered 2011-05-14 – 2011-05-16 (×6): 2 via ORAL
  Filled 2011-05-14 (×6): qty 2

## 2011-05-14 MED ORDER — MORPHINE SULFATE 4 MG/ML IJ SOLN
4.0000 mg | INTRAMUSCULAR | Status: DC | PRN
  Administered 2011-05-14 – 2011-05-16 (×4): 4 mg via INTRAMUSCULAR
  Filled 2011-05-14 (×4): qty 1

## 2011-05-14 MED ORDER — ALUM & MAG HYDROXIDE-SIMETH 200-200-20 MG/5ML PO SUSP
30.0000 mL | Freq: Four times a day (QID) | ORAL | Status: DC | PRN
  Administered 2011-05-15 – 2011-05-16 (×2): 30 mL via ORAL
  Filled 2011-05-14 (×2): qty 30

## 2011-05-14 MED ORDER — PANTOPRAZOLE SODIUM 40 MG PO TBEC
80.0000 mg | DELAYED_RELEASE_TABLET | Freq: Every day | ORAL | Status: DC
  Administered 2011-05-14 – 2011-05-15 (×2): 80 mg via ORAL
  Filled 2011-05-14 (×2): qty 1
  Filled 2011-05-14: qty 2

## 2011-05-14 MED ORDER — KCL IN DEXTROSE-NACL 20-5-0.45 MEQ/L-%-% IV SOLN
INTRAVENOUS | Status: DC
  Filled 2011-05-14 (×8): qty 1000

## 2011-05-14 MED ORDER — NEOSTIGMINE METHYLSULFATE 1 MG/ML IJ SOLN
INTRAMUSCULAR | Status: DC | PRN
  Administered 2011-05-14: 3 mg via INTRAVENOUS

## 2011-05-14 MED ORDER — SODIUM CHLORIDE 0.9 % IJ SOLN: 3.0000 mL | INTRAMUSCULAR | Status: DC | PRN

## 2011-05-14 MED ORDER — CYCLOBENZAPRINE HCL 10 MG PO TABS
10.0000 mg | ORAL_TABLET | Freq: Three times a day (TID) | ORAL | Status: DC | PRN
  Administered 2011-05-14 – 2011-05-16 (×3): 10 mg via ORAL
  Filled 2011-05-14 (×3): qty 1

## 2011-05-14 MED ORDER — BISACODYL 10 MG RE SUPP: 10.0000 mg | Freq: Every day | RECTAL | Status: DC | PRN

## 2011-05-14 MED ORDER — IPRATROPIUM-ALBUTEROL 18-103 MCG/ACT IN AERO: 2.0000 | INHALATION_SPRAY | RESPIRATORY_TRACT | Status: DC | PRN

## 2011-05-14 MED ORDER — LACTATED RINGERS IV SOLN
INTRAVENOUS | Status: DC | PRN
  Administered 2011-05-14 (×2): via INTRAVENOUS

## 2011-05-14 MED ORDER — KETOROLAC TROMETHAMINE 30 MG/ML IJ SOLN: 30.0000 mg | Freq: Once | INTRAMUSCULAR | Status: DC

## 2011-05-14 MED ORDER — ACETAMINOPHEN 325 MG PO TABS: 650.0000 mg | ORAL_TABLET | ORAL | Status: DC | PRN

## 2011-05-14 MED ORDER — HYDROXYZINE HCL 25 MG PO TABS: 50.0000 mg | ORAL_TABLET | ORAL | Status: DC | PRN

## 2011-05-14 MED ORDER — GLYCOPYRROLATE 0.2 MG/ML IJ SOLN
INTRAMUSCULAR | Status: DC | PRN
  Administered 2011-05-14: .5 mg via INTRAVENOUS

## 2011-05-14 MED ORDER — VECURONIUM BROMIDE 10 MG IV SOLR
INTRAVENOUS | Status: DC | PRN
  Administered 2011-05-14: 2 mg via INTRAVENOUS

## 2011-05-14 MED ORDER — MENTHOL 3 MG MT LOZG
1.0000 | LOZENGE | OROMUCOSAL | Status: DC | PRN
  Administered 2011-05-15: 3 mg via ORAL
  Filled 2011-05-14: qty 9

## 2011-05-14 MED ORDER — SODIUM CHLORIDE 0.9 % IV SOLN
INTRAVENOUS | Status: AC
  Filled 2011-05-14: qty 500

## 2011-05-14 MED ORDER — GABAPENTIN 300 MG PO CAPS
300.0000 mg | ORAL_CAPSULE | Freq: Three times a day (TID) | ORAL | Status: DC
  Administered 2011-05-14 – 2011-05-16 (×6): 300 mg via ORAL
  Filled 2011-05-14 (×8): qty 1

## 2011-05-14 MED ORDER — ONDANSETRON HCL 4 MG/2ML IJ SOLN: 4.0000 mg | Freq: Once | INTRAMUSCULAR | Status: DC | PRN

## 2011-05-14 MED ORDER — FENTANYL CITRATE 0.05 MG/ML IJ SOLN
INTRAMUSCULAR | Status: DC | PRN
  Administered 2011-05-14: 100 ug via INTRAVENOUS
  Administered 2011-05-14: 150 ug via INTRAVENOUS
  Administered 2011-05-14: 50 ug via INTRAVENOUS
  Administered 2011-05-14 (×2): 100 ug via INTRAVENOUS

## 2011-05-14 MED ORDER — HYDROXYZINE HCL 50 MG/ML IM SOLN: 50.0000 mg | INTRAMUSCULAR | Status: DC | PRN

## 2011-05-14 MED ORDER — 0.9 % SODIUM CHLORIDE (POUR BTL) OPTIME
TOPICAL | Status: DC | PRN
  Administered 2011-05-14: 1000 mL

## 2011-05-14 MED ORDER — BACITRACIN 50000 UNITS IM SOLR
INTRAMUSCULAR | Status: AC
  Filled 2011-05-14: qty 1

## 2011-05-14 MED ORDER — FAMOTIDINE 20 MG PO TABS
20.0000 mg | ORAL_TABLET | Freq: Two times a day (BID) | ORAL | Status: DC
  Administered 2011-05-14 – 2011-05-16 (×5): 20 mg via ORAL
  Filled 2011-05-14 (×6): qty 1

## 2011-05-14 MED ORDER — VANCOMYCIN HCL 1000 MG IV SOLR
1000.0000 mg | INTRAVENOUS | Status: DC | PRN
  Administered 2011-05-14: 1000 mg via INTRAVENOUS

## 2011-05-14 MED ORDER — MORPHINE SULFATE 4 MG/ML IJ SOLN
INTRAMUSCULAR | Status: AC
  Administered 2011-05-14: 4 mg
  Filled 2011-05-14: qty 1

## 2011-05-14 MED ORDER — ALPRAZOLAM 1 MG PO TABS: 1.0000 mg | ORAL_TABLET | Freq: Three times a day (TID) | ORAL | Status: DC

## 2011-05-14 MED ORDER — ROCURONIUM BROMIDE 100 MG/10ML IV SOLN
INTRAVENOUS | Status: DC | PRN
  Administered 2011-05-14: 50 mg via INTRAVENOUS

## 2011-05-14 MED ORDER — SODIUM CHLORIDE 0.9 % IR SOLN
Status: DC | PRN
  Administered 2011-05-14: 09:00:00

## 2011-05-14 MED ORDER — ALPRAZOLAM 0.5 MG PO TABS
1.0000 mg | ORAL_TABLET | Freq: Three times a day (TID) | ORAL | Status: DC
  Administered 2011-05-14 – 2011-05-16 (×6): 1 mg via ORAL
  Filled 2011-05-14 (×6): qty 2

## 2011-05-14 MED ORDER — THROMBIN 20000 UNITS EX KIT
PACK | CUTANEOUS | Status: DC | PRN
  Administered 2011-05-14: 09:00:00 via TOPICAL

## 2011-05-14 MED ORDER — BUPIVACAINE HCL (PF) 0.5 % IJ SOLN
INTRAMUSCULAR | Status: DC | PRN
  Administered 2011-05-14: 10 mL

## 2011-05-14 MED ORDER — SODIUM CHLORIDE 0.9 % IV SOLN: 250.0000 mL | INTRAVENOUS | Status: DC

## 2011-05-14 MED ORDER — NALOXONE HCL 0.4 MG/ML IJ SOLN
INTRAMUSCULAR | Status: AC
  Filled 2011-05-14: qty 1

## 2011-05-14 MED ORDER — ACETAMINOPHEN 650 MG RE SUPP: 650.0000 mg | RECTAL | Status: DC | PRN

## 2011-05-14 MED ORDER — HYDROCODONE-ACETAMINOPHEN 5-325 MG PO TABS
1.0000 | ORAL_TABLET | ORAL | Status: DC | PRN
  Administered 2011-05-16: 2 via ORAL
  Filled 2011-05-14: qty 2

## 2011-05-14 MED ORDER — ZOLPIDEM TARTRATE 5 MG PO TABS: 10.0000 mg | ORAL_TABLET | Freq: Every evening | ORAL | Status: DC | PRN

## 2011-05-14 MED ORDER — EPHEDRINE SULFATE 50 MG/ML IJ SOLN
INTRAMUSCULAR | Status: DC | PRN
  Administered 2011-05-14: 15 mg via INTRAVENOUS
  Administered 2011-05-14: 10 mg via INTRAVENOUS

## 2011-05-14 SURGICAL SUPPLY — 78 items
3.5 x 12 multi angle screw ×4 IMPLANT
3.5 x 14 multi angle screw ×2 IMPLANT
ADH SKN CLS APL DERMABOND .7 (GAUZE/BANDAGES/DRESSINGS) ×2
BAG DECANTER FOR FLEXI CONT (MISCELLANEOUS) ×2 IMPLANT
BANDAGE GAUZE 4  KLING STR (GAUZE/BANDAGES/DRESSINGS) IMPLANT
BIT DRILL 12MM USE WITH LUHR (BIT) IMPLANT
BIT DRILL DENTAL 1.5X29.2X16 (BIT) IMPLANT
BIT DRILL NEURO 2X3.1 SFT TUCH (MISCELLANEOUS) ×1 IMPLANT
BIT DRILL WIRE PASS 1.3MM (BIT) IMPLANT
BLADE SURG 11 STRL SS (BLADE) ×2 IMPLANT
BLADE SURG ROTATE 9660 (MISCELLANEOUS) ×2 IMPLANT
CABLE SNG STERILE W/CRIMP (Neuro Prosthesis/Implant) ×1 IMPLANT
CANISTER SUCTION 2500CC (MISCELLANEOUS) ×2 IMPLANT
CLOTH BEACON ORANGE TIMEOUT ST (SAFETY) ×2 IMPLANT
CONT SPEC 4OZ CLIKSEAL STRL BL (MISCELLANEOUS) ×2 IMPLANT
COVER MAYO STAND STRL (DRAPES) IMPLANT
COVER TABLE BACK 60X90 (DRAPES) ×1 IMPLANT
DECANTER SPIKE VIAL GLASS SM (MISCELLANEOUS) ×2 IMPLANT
DENTAL DRILL BIT ×1 IMPLANT
DERMABOND ADVANCED (GAUZE/BANDAGES/DRESSINGS) ×2
DERMABOND ADVANCED .7 DNX12 (GAUZE/BANDAGES/DRESSINGS) ×1 IMPLANT
DRAPE C-ARM 42X72 X-RAY (DRAPES) ×4 IMPLANT
DRAPE LAPAROTOMY 100X72 PEDS (DRAPES) ×2 IMPLANT
DRAPE POUCH INSTRU U-SHP 10X18 (DRAPES) ×2 IMPLANT
DRAPE PROXIMA HALF (DRAPES) IMPLANT
DRILL NEURO 2X3.1 SOFT TOUCH (MISCELLANEOUS) ×2
DRILL WIRE PASS 1.3MM (BIT)
DRSG EMULSION OIL 3X3 NADH (GAUZE/BANDAGES/DRESSINGS) IMPLANT
ELECT REM PT RETURN 9FT ADLT (ELECTROSURGICAL) ×2
ELECTRODE REM PT RTRN 9FT ADLT (ELECTROSURGICAL) ×1 IMPLANT
EVACUATOR 1/8 PVC DRAIN (DRAIN) IMPLANT
GAUZE SPONGE 4X4 16PLY XRAY LF (GAUZE/BANDAGES/DRESSINGS) IMPLANT
GLOVE BIOGEL PI IND STRL 6.5 (GLOVE) IMPLANT
GLOVE BIOGEL PI IND STRL 8 (GLOVE) ×1 IMPLANT
GLOVE BIOGEL PI INDICATOR 6.5 (GLOVE) ×2
GLOVE BIOGEL PI INDICATOR 8 (GLOVE) ×1
GLOVE ECLIPSE 7.5 STRL STRAW (GLOVE) ×2 IMPLANT
GLOVE ECLIPSE 8.5 STRL (GLOVE) ×1 IMPLANT
GLOVE EXAM NITRILE LRG STRL (GLOVE) IMPLANT
GLOVE EXAM NITRILE MD LF STRL (GLOVE) ×2 IMPLANT
GLOVE EXAM NITRILE XL STR (GLOVE) IMPLANT
GLOVE EXAM NITRILE XS STR PU (GLOVE) IMPLANT
GLOVE INDICATOR 7.0 STRL GRN (GLOVE) ×3 IMPLANT
GOWN BRE IMP SLV AUR LG STRL (GOWN DISPOSABLE) ×2 IMPLANT
GOWN BRE IMP SLV AUR XL STRL (GOWN DISPOSABLE) ×3 IMPLANT
GOWN STRL REIN 2XL LVL4 (GOWN DISPOSABLE) IMPLANT
HEMOSTAT SURGICEL 2X14 (HEMOSTASIS) IMPLANT
KIT BASIN OR (CUSTOM PROCEDURE TRAY) ×2 IMPLANT
KIT INFUSE X SMALL 1.4CC (Orthopedic Implant) ×1 IMPLANT
KIT ROOM TURNOVER OR (KITS) ×2 IMPLANT
NDL HYPO 18GX1.5 BLUNT FILL (NEEDLE) IMPLANT
NDL SPNL 18GX3.5 QUINCKE PK (NEEDLE) ×1 IMPLANT
NDL SPNL 22GX3.5 QUINCKE BK (NEEDLE) ×1 IMPLANT
NEEDLE HYPO 18GX1.5 BLUNT FILL (NEEDLE) IMPLANT
NEEDLE SPNL 18GX3.5 QUINCKE PK (NEEDLE) ×4 IMPLANT
NEEDLE SPNL 22GX3.5 QUINCKE BK (NEEDLE) ×2 IMPLANT
NS IRRIG 1000ML POUR BTL (IV SOLUTION) ×2 IMPLANT
PACK LAMINECTOMY NEURO (CUSTOM PROCEDURE TRAY) ×2 IMPLANT
PAD ARMBOARD 7.5X6 YLW CONV (MISCELLANEOUS) ×6 IMPLANT
PIN MAYFIELD SKULL DISP (PIN) ×2 IMPLANT
ROD 120MM (Rod) ×2 IMPLANT
ROD SPNL 120X3.5XNS LF TI (Rod) IMPLANT
SCREW SET THREADED (Screw) ×6 IMPLANT
SPONGE GAUZE 4X4 12PLY (GAUZE/BANDAGES/DRESSINGS) IMPLANT
SPONGE LAP 4X18 X RAY DECT (DISPOSABLE) IMPLANT
SPONGE SURGIFOAM ABS GEL 100 (HEMOSTASIS) ×2 IMPLANT
STAPLER SKIN PROX WIDE 3.9 (STAPLE) ×2 IMPLANT
STRIP BIOACTIVE VITOSS 25X52X4 (Orthopedic Implant) ×1 IMPLANT
SUT ETHILON 3 0 FSL (SUTURE) ×1 IMPLANT
SUT VIC AB 0 CT1 18XCR BRD8 (SUTURE) ×1 IMPLANT
SUT VIC AB 0 CT1 8-18 (SUTURE) ×4
SUT VIC AB 2-0 CP2 18 (SUTURE) ×3 IMPLANT
SYR 20ML ECCENTRIC (SYRINGE) ×2 IMPLANT
TOWEL OR 17X24 6PK STRL BLUE (TOWEL DISPOSABLE) ×2 IMPLANT
TOWEL OR 17X26 10 PK STRL BLUE (TOWEL DISPOSABLE) ×2 IMPLANT
TRAY FOLEY CATH 14FRSI W/METER (CATHETERS) ×1 IMPLANT
UNDERPAD 30X30 INCONTINENT (UNDERPADS AND DIAPERS) ×2 IMPLANT
WATER STERILE IRR 1000ML POUR (IV SOLUTION) ×2 IMPLANT

## 2011-05-14 NOTE — Transfer of Care (Signed)
Immediate Anesthesia Transfer of Care Note  Patient: [name]  Procedure(s) Performed: Procedure(s) (LRB): POSTERIOR CERVICAL FUSION/FORAMINOTOMY LEVEL 3 (N/A)  Patient Location: PACU  Anesthesia Type: General  Level of Consciousness: awake  Airway & Oxygen Therapy: Patient Spontanous Breathing and Patient connected to nasal cannula oxygen  Post-op Assessment: Report given to PACU RN and Post -op Vital signs reviewed and stable  Post vital signs: Reviewed and stable  Complications: No apparent anesthesia complications

## 2011-05-14 NOTE — Op Note (Signed)
05/14/2011  10:30 AM  PATIENT:  [name]  41 y.o. 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  ANESTHESIA:   general  EBL:  Total I/O In: 1900 [I.V.:1400; IV Piggyback:500] Out: 285 [Urine:275; Blood:10]  BLOOD ADMINISTERED:none  COUNT: Correct per nursing staff  DICTATION: Patient was brought to the operating room, placed under general endotracheal anesthesia. A 3 pin Mayfield head holder was applied to the head, and the patient was turned to a prone position. The posterior neck was prepped with Betadine soap and solution and draped in a sterile fashion.  The midline was infiltrated local  anesthetic with epinephrine. Using the C-arm fluoroscope to localize identified throughout the procedure, we identified the C4-C7 level, and performed a midline incision. This was carried down to the subcutaneous tissue to the posterior cervical fascia. Bipolar cautery and electrocautery were used to maintain hemostasis. The paracervical musculature was dissected from the spinous process and lamina in a subperiosteal fashion. We dissected laterally exposing the facet complexes. There was notable widening of the C6-7 interspinous space. We then identified entry points posteriorly for the lateral mass screws. Each entry point was started with the high-speed drill and then continued with a 3.0 mm drill. Entry points were selected on the right  side at C4, C5, C6, C7, and on the left side at C4, C6, and C7. Each lateral mass hole was tapped with a 3.0 mm tap, and then we placed 3.5 x 12 mm screws on the right at C4, C5, and C7, and a 3.5 x 14 mm screw on the right at C6, and then we placed 3.5 x 12 mm screws on the left at C4 and C7, however at this C4 level the screw broke out because of the previous fracture was present (we did not attempt to place a screw on the left at C5 because the previous fracture the that was present as well), and a 3.5 x 40 mm screw on the right at C6. We then made a horizontal hole through the C6 spinous process, and passed a single Songer cable through the hole and then beneath the C7 spinous process and then gradually tightened it down. Once it was fully tightened down we achieved good restoration of the C6-7 alignment, and crimped the clamp. We then cut 120 mm rod to 2 separate rods, approximately 50 mm for the right side and approximately 20 mm the left side. The rod placed within the screw heads and secured with locking caps, which were then tightened against a counter torque. We then decorticated the lamina and spinous processes as well as facet joints with the high-speed drill. We laid down small pledgets of infuse and then a combination of Vitoss BA and locally harvested morcellized autograft. We then proceeded with closure. Deep fascia was closed with interrupted undyed 0 Vicryl sutures, Scarpa's fascia closed with interrupted undyed 0 Vicryl sutures, and the subcutaneous and subcuticular closed interrupted inverted 2-0 Vicryl sutures. Skin is approximate Dermabond. We'll was dressed with  sterile gauze and Hypafix. Following surgery the patient was turned back in supine position, the 3 pin Mayfield head holder was removed, and the patient was then reversed from the anesthetic, extubated, and transferred to the recovery room for further care.  PLAN OF CARE: Admit for overnight observation  PATIENT DISPOSITION:  PACU  - hemodynamically stable.   Delay start of Pharmacological VTE agent (>24hrs) due to surgical blood loss or risk of bleeding:  yes

## 2011-05-14 NOTE — Anesthesia Preprocedure Evaluation (Addendum)
Anesthesia Evaluation  Patient identified by MRN, date of birth, ID band Patient awake    Reviewed: Allergy & Precautions, H&P , NPO status , Patient's Chart, lab work & pertinent test results, reviewed documented beta blocker date and time   Airway Mallampati: II TM Distance: >3 FB Neck ROM: limited    Dental  (+) Teeth Intact and Dental Advisory Given   Pulmonary asthma , Current Smoker,          Cardiovascular Rhythm:regular Rate:Normal     Neuro/Psych  Headaches, Anxiety    GI/Hepatic GERD-  Medicated,  Endo/Other  Hypothyroidism   Renal/GU      Musculoskeletal   Abdominal   Peds  Hematology   Anesthesia Other Findings   Reproductive/Obstetrics                          Anesthesia Physical Anesthesia Plan  ASA: II  Anesthesia Plan: General   Post-op Pain Management:    Induction: Intravenous  Airway Management Planned: Oral ETT  Additional Equipment:   Intra-op Plan:   Post-operative Plan: Extubation in OR  Informed Consent: I have reviewed the patients History and Physical, chart, labs and discussed the procedure including the risks, benefits and alternatives for the proposed anesthesia with the patient or authorized representative who has indicated his/her understanding and acceptance.     Plan Discussed with: CRNA, Anesthesiologist and Surgeon  Anesthesia Plan Comments:         Anesthesia Quick Evaluation

## 2011-05-14 NOTE — Preoperative (Signed)
Beta Blockers   Reason not to administer Beta Blockers:Not Applicable. No home beta blockers 

## 2011-05-14 NOTE — Anesthesia Procedure Notes (Signed)
Procedure Name: Intubation Date/Time: 05/14/2011 7:39 AM Performed by: Mancel Parsons Pre-anesthesia Checklist: Patient identified, Timeout performed, Emergency Drugs available, Suction available and Patient being monitored Patient Re-evaluated:Patient Re-evaluated prior to inductionOxygen Delivery Method: Circle system utilized Preoxygenation: Pre-oxygenation with 100% oxygen Intubation Type: IV induction Ventilation: Mask ventilation without difficulty Laryngoscope Size: Mac and 3 Grade View: Grade I Tube type: Oral Tube size: 7.5 mm Number of attempts: 1 Airway Equipment and Method: Stylet and LTA kit utilized Placement Confirmation: ETT inserted through vocal cords under direct vision,  positive ETCO2 and breath sounds checked- equal and bilateral Secured at: 21 cm Tube secured with: Tape Dental Injury: Teeth and Oropharynx as per pre-operative assessment

## 2011-05-14 NOTE — Anesthesia Postprocedure Evaluation (Signed)
  Anesthesia Post-op Note  Patient: [name]  Procedure(s) Performed: Procedure(s) (LRB): POSTERIOR CERVICAL FUSION/FORAMINOTOMY LEVEL 3 (N/A)  Patient Location: PACU  Anesthesia Type: General  Level of Consciousness: awake  Airway and Oxygen Therapy: Patient Spontanous Breathing  Post-op Pain: mild  Post-op Assessment: Post-op Vital signs reviewed  Post-op Vital Signs: Reviewed  Complications: No apparent anesthesia complications

## 2011-05-14 NOTE — Progress Notes (Signed)
Filed Vitals:   05/14/11 0616 05/14/11 1000 05/14/11 1200 05/14/11 1730  BP: 103/70   109/74  Pulse: 66   70  Temp: 97.9 F (36.6 C) 96.8 F (36 C) 97.6 F (36.4 C) 97.4 F (36.3 C)  TempSrc: Oral     Resp: 18   16  SpO2: 100%   99%    Patient up and ambulating in halls. She has had moderate incisional pain. Dressing is clean and dry. Moving all 4 extremities well. Foley DC'd about 30 minutes ago, nursing staff to monitor voiding function of the next 6-12 hours.  Plan: Nursing staff to continue to work with patient on improving overall comfort level. I've encouraged patient to be admitting regularly in the halls.  [name], MD 05/14/2011, 5:44 PM
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
## 2011-05-15 NOTE — Progress Notes (Signed)
Utilization review completed. Kharson Rasmusson, RN, BSN.  05/15/11 

## 2011-05-15 NOTE — Progress Notes (Signed)
Filed Vitals:   05/14/11 1730 05/14/11 2000 05/15/11 0000 05/15/11 0400  BP: 109/74 104/66 110/75 116/79  Pulse: 70 67 80 100  Temp: 97.4 F (36.3 C) 97.7 F (36.5 C) 98.8 F (37.1 C) 98.6 F (37 C)  TempSrc:      Resp: 16 16 16 18   SpO2: 99% 93% 97% 95%    Patient up and ambulating some. Continue to have moderate incisional discomfort. Continue to receive Percocet, Flexeril, and Toradol IV. Dressing clean and dry. Voiding well. Moving all extremities well.  Plan: Encouraged to ambulate actively in the halls.  [name], MD 05/15/2011, 8:55 AM

## 2011-05-16 MED ORDER — OXYCODONE-ACETAMINOPHEN 5-325 MG PO TABS: 1.0000 | ORAL_TABLET | ORAL | Status: AC | PRN

## 2011-05-16 MED ORDER — CYCLOBENZAPRINE HCL 10 MG PO TABS: 10.0000 mg | ORAL_TABLET | Freq: Three times a day (TID) | ORAL | Status: AC | PRN

## 2011-05-16 NOTE — Discharge Instructions (Signed)

## 2011-05-16 NOTE — Discharge Summary (Signed)
Physician Discharge Summary  Patient ID: [name] MRN: [id number] DOB/AGE: Aug 16, 1970 41 y.o.  Admit date: 05/14/2011 Discharge date: 05/16/2011  Admission Diagnoses: C6-7 cervical instability, fracture of posterior elements of C4 and C5, neck pain  Discharge Diagnoses: C6-7 cervical instability, fracture of posterior elements of C4 and C5, neck pain  Discharged Condition: good  Hospital Course: Patient was admitted underwent a C4 to C7 posterior cervical arthrodesis with viewpoint to lateral mass screws and rods, interspinous Songer cable at C6-7, and bone graft. Postoperatively she had moderate pain that has steadily decreased. She has steadily increased her activity, and is actively ambulating in the halls. Her wound is healing nicely. There is no erythema, swelling, or drainage. She is being discharged to home with instructions regarding wound care and activities. She is to return for followup with me in the office in 3 weeks.  Discharge Exam: Blood pressure 119/71, pulse 96, temperature 98 F (36.7 C), temperature source Oral, resp. rate 18, SpO2 94.00%.  Disposition: Home   Medication List  As of 05/16/2011  8:23 AM   TAKE these medications         albuterol-ipratropium 18-103 MCG/ACT inhaler   Commonly known as: COMBIVENT   Inhale 2 puffs into the lungs every 4 (four) hours as needed. For shortness of breath      ALPRAZolam 1 MG tablet   Commonly known as: XANAX   Take 1 mg by mouth 3 (three) times daily.      cyclobenzaprine 10 MG tablet   Commonly known as: FLEXERIL   Take 10 mg by mouth 3 (three) times daily as needed. For muscle spasms      cyclobenzaprine 10 MG tablet   Commonly known as: FLEXERIL   Take 1 tablet (10 mg total) by mouth 3 (three) times daily as needed for muscle spasms.      FLUoxetine 20 MG tablet   Commonly known as: PROZAC   Take 60 mg by mouth daily.      gabapentin 300 MG capsule   Commonly known as: NEURONTIN   Take 300 mg by mouth 3  (three) times daily.      HYDROcodone-acetaminophen 5-325 MG per tablet   Commonly known as: NORCO   Take 1 tablet by mouth every 6 (six) hours as needed.      levothyroxine 137 MCG tablet   Commonly known as: SYNTHROID, LEVOTHROID   Take 137 mcg by mouth daily.      omeprazole 40 MG capsule   Commonly known as: PRILOSEC   Take 40 mg by mouth every morning.      oxyCODONE-acetaminophen 5-325 MG per tablet   Commonly known as: PERCOCET   Take 1 tablet by mouth every 4 (four) hours as needed.      oxyCODONE-acetaminophen 5-325 MG per tablet   Commonly known as: PERCOCET   Take 1-2 tablets by mouth every 4 (four) hours as needed for pain.      ranitidine 300 MG capsule   Commonly known as: ZANTAC   Take 300 mg by mouth every evening.             Signed: [name], MD 05/16/2011, 8:23 AM

## 2011-05-21 ENCOUNTER — Encounter (HOSPITAL_COMMUNITY): Payer: Self-pay | Admitting: Neurosurgery

## 2011-12-11 ENCOUNTER — Ambulatory Visit: Payer: Medicaid Other | Admitting: Physical Therapy

## 2013-07-24 ENCOUNTER — Encounter (HOSPITAL_COMMUNITY): Payer: Self-pay | Admitting: Emergency Medicine

## 2013-07-24 ENCOUNTER — Emergency Department (HOSPITAL_COMMUNITY)
Admission: EM | Admit: 2013-07-24 | Discharge: 2013-07-24 | Disposition: A | Discharge status: Home / Self Care | Payer: Medicaid Other | Attending: Emergency Medicine | Admitting: Emergency Medicine

## 2013-07-24 DIAGNOSIS — Z8719 Personal history of other diseases of the digestive system: Secondary | ICD-10-CM | POA: Insufficient documentation

## 2013-07-24 DIAGNOSIS — L241 Irritant contact dermatitis due to oils and greases: Secondary | ICD-10-CM | POA: Diagnosis not present

## 2013-07-24 DIAGNOSIS — F17200 Nicotine dependence, unspecified, uncomplicated: Secondary | ICD-10-CM | POA: Diagnosis not present

## 2013-07-24 DIAGNOSIS — Z8639 Personal history of other endocrine, nutritional and metabolic disease: Secondary | ICD-10-CM | POA: Diagnosis not present

## 2013-07-24 DIAGNOSIS — Z862 Personal history of diseases of the blood and blood-forming organs and certain disorders involving the immune mechanism: Secondary | ICD-10-CM | POA: Diagnosis not present

## 2013-07-24 DIAGNOSIS — T7840XA Allergy, unspecified, initial encounter: Secondary | ICD-10-CM

## 2013-07-24 DIAGNOSIS — F411 Generalized anxiety disorder: Secondary | ICD-10-CM | POA: Insufficient documentation

## 2013-07-24 DIAGNOSIS — R220 Localized swelling, mass and lump, head: Secondary | ICD-10-CM | POA: Diagnosis present

## 2013-07-24 DIAGNOSIS — Z79899 Other long term (current) drug therapy: Secondary | ICD-10-CM | POA: Diagnosis not present

## 2013-07-24 LAB — BASIC METABOLIC PANEL
Anion gap: 11 (ref 5–15)
BUN: 13 mg/dL (ref 6–23)
CHLORIDE: 101 meq/L (ref 96–112)
CO2: 27 meq/L (ref 19–32)
Calcium: 9.1 mg/dL (ref 8.4–10.5)
Creatinine, Ser: 0.85 mg/dL (ref 0.50–1.10)
GFR calc Af Amer: >90 mL/min (ref >90)
GFR calc non Af Amer: 83 mL/min — ABNORMAL LOW (ref >90)
GLUCOSE: 107 mg/dL — AB (ref 70–99)
POTASSIUM: 3.8 meq/L (ref 3.7–5.3)
SODIUM: 139 meq/L (ref 137–147)

## 2013-07-24 LAB — CBC WITH DIFFERENTIAL/PLATELET
Basophils Absolute: 0 10*3/uL (ref 0.0–0.1)
Basophils Relative: 0 % (ref 0–1)
Eosinophils Absolute: 0 10*3/uL (ref 0.0–0.7)
Eosinophils Relative: 0 % (ref 0–5)
HCT: 38.8 % (ref 36.0–46.0)
Hemoglobin: 13.1 g/dL (ref 12.0–15.0)
LYMPHS ABS: 1.1 10*3/uL (ref 0.7–4.0)
LYMPHS PCT: 7 % — AB (ref 12–46)
MCH: 30.6 pg (ref 26.0–34.0)
MCHC: 33.8 g/dL (ref 30.0–36.0)
MCV: 90.7 fL (ref 78.0–100.0)
Monocytes Absolute: 0.8 10*3/uL (ref 0.1–1.0)
Monocytes Relative: 5 % (ref 3–12)
NEUTROS PCT: 88 % — AB (ref 43–77)
Neutro Abs: 13.4 10*3/uL — ABNORMAL HIGH (ref 1.7–7.7)
PLATELETS: 243 10*3/uL (ref 150–400)
RBC: 4.28 MIL/uL (ref 3.87–5.11)
RDW: 13 % (ref 11.5–15.5)
WBC: 15.3 10*3/uL — AB (ref 4.0–10.5)

## 2013-07-24 MED ORDER — DIPHENHYDRAMINE HCL 50 MG/ML IJ SOLN: 25.0000 mg | Freq: Once | INTRAMUSCULAR | Status: DC

## 2013-07-24 MED ORDER — IBUPROFEN 800 MG PO TABS
800.0000 mg | ORAL_TABLET | Freq: Once | ORAL | Status: AC
Start: 2013-07-24 — End: 2013-07-24
  Administered 2013-07-24: 800 mg via ORAL
  Filled 2013-07-24: qty 1

## 2013-07-24 MED ORDER — FAMOTIDINE IN NACL 20-0.9 MG/50ML-% IV SOLN
20.0000 mg | Freq: Once | INTRAVENOUS | Status: AC
  Administered 2013-07-24: 20 mg via INTRAVENOUS
  Filled 2013-07-24: qty 50

## 2013-07-24 MED ORDER — METHYLPREDNISOLONE SODIUM SUCC 125 MG IJ SOLR
125.0000 mg | Freq: Once | INTRAMUSCULAR | Status: AC
  Administered 2013-07-24: 125 mg via INTRAVENOUS
  Filled 2013-07-24: qty 2

## 2013-07-24 MED ORDER — PREDNISONE 10 MG PO TABS: 20.0000 mg | ORAL_TABLET | Freq: Every day | ORAL | Status: DC

## 2013-07-24 NOTE — ED Notes (Signed)
MD at bedside. 

## 2013-07-24 NOTE — ED Notes (Signed)
Per EMS: pt was working with new spandex close to face; at 11 last night noticed her feeling swollen. Pt went to urgent care this morning at 830 with hives and facial swelling; Was given a steroid shot, PO zantac and told to start benadryl. Pt went home and to sleep; woke up with increased facial swelling, reports taking 2 benadryl 2 hours ago. EMS gave 50 mg Benadryl IV; BP117/79 HR 84 RR 20 O2 100%.  Pt c/o lip and facial swelling, denies any throat or tongue involvement. States her saliva feels thick and c/o aching feeling.

## 2013-07-24 NOTE — Discharge Instructions (Signed)
Take zantac 150mg  twice a day.  Take benadryl 25 mg every 4-6 hours for swelling or itching
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
## 2013-10-27 ENCOUNTER — Encounter (HOSPITAL_COMMUNITY): Payer: Self-pay | Admitting: Emergency Medicine

## 2013-10-27 ENCOUNTER — Emergency Department (HOSPITAL_COMMUNITY)
Admission: EM | Admit: 2013-10-27 | Discharge: 2013-10-28 | Disposition: A | Discharge status: Home / Self Care | Payer: Medicaid Other | Attending: Emergency Medicine | Admitting: Emergency Medicine

## 2013-10-27 ENCOUNTER — Emergency Department (HOSPITAL_COMMUNITY): Payer: Medicaid Other

## 2013-10-27 DIAGNOSIS — Z79899 Other long term (current) drug therapy: Secondary | ICD-10-CM | POA: Insufficient documentation

## 2013-10-27 DIAGNOSIS — Z7952 Long term (current) use of systemic steroids: Secondary | ICD-10-CM | POA: Insufficient documentation

## 2013-10-27 DIAGNOSIS — Y9389 Activity, other specified: Secondary | ICD-10-CM | POA: Insufficient documentation

## 2013-10-27 DIAGNOSIS — Z8719 Personal history of other diseases of the digestive system: Secondary | ICD-10-CM | POA: Insufficient documentation

## 2013-10-27 DIAGNOSIS — W230XXA Caught, crushed, jammed, or pinched between moving objects, initial encounter: Secondary | ICD-10-CM | POA: Insufficient documentation

## 2013-10-27 DIAGNOSIS — IMO0002 Reserved for concepts with insufficient information to code with codable children: Secondary | ICD-10-CM

## 2013-10-27 DIAGNOSIS — S62627A Displaced fracture of medial phalanx of left little finger, initial encounter for closed fracture: Secondary | ICD-10-CM | POA: Insufficient documentation

## 2013-10-27 DIAGNOSIS — E039 Hypothyroidism, unspecified: Secondary | ICD-10-CM | POA: Insufficient documentation

## 2013-10-27 DIAGNOSIS — S62609B Fracture of unspecified phalanx of unspecified finger, initial encounter for open fracture: Secondary | ICD-10-CM

## 2013-10-27 DIAGNOSIS — Z720 Tobacco use: Secondary | ICD-10-CM | POA: Insufficient documentation

## 2013-10-27 DIAGNOSIS — Y92099 Unspecified place in other non-institutional residence as the place of occurrence of the external cause: Secondary | ICD-10-CM | POA: Insufficient documentation

## 2013-10-27 DIAGNOSIS — Z792 Long term (current) use of antibiotics: Secondary | ICD-10-CM | POA: Insufficient documentation

## 2013-10-27 DIAGNOSIS — Z8659 Personal history of other mental and behavioral disorders: Secondary | ICD-10-CM | POA: Insufficient documentation

## 2013-10-27 DIAGNOSIS — S61217A Laceration without foreign body of left little finger without damage to nail, initial encounter: Secondary | ICD-10-CM | POA: Insufficient documentation
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
## 2013-10-28 MED ORDER — BUPIVACAINE HCL (PF) 0.5 % IJ SOLN
10.0000 mL | Freq: Once | INTRAMUSCULAR | Status: AC
  Administered 2013-10-28: 10 mL
  Filled 2013-10-28: qty 30

## 2013-10-28 MED ORDER — OXYCODONE-ACETAMINOPHEN 5-325 MG PO TABS: 1.0000 | ORAL_TABLET | ORAL | Status: DC | PRN

## 2013-10-28 MED ORDER — CEPHALEXIN 500 MG PO CAPS: 500.0000 mg | ORAL_CAPSULE | Freq: Four times a day (QID) | ORAL | Status: DC

## 2013-10-28 MED ORDER — CEPHALEXIN 500 MG PO CAPS
500.0000 mg | ORAL_CAPSULE | Freq: Once | ORAL | Status: AC
  Administered 2013-10-28: 500 mg via ORAL
  Filled 2013-10-28: qty 1

## 2013-10-28 NOTE — ED Provider Notes (Signed)
CSN: [id number]     Arrival date & time 10/27/13  2308 History   First MD Initiated Contact with Patient 10/27/13 2352     Chief Complaint  Patient presents with  . Laceration     (Consider location/radiation/quality/duration/timing/severity/associated sxs/prior Treatment) The history is provided by the patient.   [name] is a 43 y.o. right handed  female presenting with laceration to her left 5th finger when it was accidentally shut in a door jamb in her home.  She has pain and swelling along with now hemostatic wound after applying direct pressure to the wound.  She denies numbness distal to the injury site and denies other injury.  Her tetanus is current, last received 2/13.       Past Medical History  Diagnosis Date  . Hypothyroid   . Anxiety   . Acid reflux   . [id number](478.2Headache(784.0)    Past Surgical History  Procedure Laterality Date  . Abdominal hysterectomy    . Anterior cervical decomp/discectomy fusion  02/19/2011    Procedure: ANTERIOR CERVICAL DECOMPRESSION/DISCECTOMY FUSION 2 LEVELS;  Surgeon: Hewitt Shortsobert W Nudelman, MD;  Location: [facility];  Service: Neurosurgery;  Laterality: N/A;  Cervical four-five,Cervical Five-six Anterior Cervical Decompression and Fusion possible posterior cervical  . Posterior cervical fusion/foraminotomy  02/19/2011    Procedure: POSTERIOR CERVICAL FUSION/FORAMINOTOMY LEVEL 2;  Surgeon: Hewitt Shortsobert W Nudelman, MD;  Location: [facility];  Service: Neurosurgery;  Laterality: N/A;  . Posterior cervical fusion/foraminotomy  05/14/2011    Procedure: POSTERIOR CERVICAL FUSION/FORAMINOTOMY LEVEL 3;  Surgeon: Hewitt Shortsobert W Nudelman, MD;  Location: [facility];  Service: Neurosurgery;  Laterality: N/A;  Cervical four-five Cervical five-six Cervical six-seven posterior cervical arthrodesis with instrumentation   No family history on file. History  Substance Use Topics  . Smoking status: Current Every Day Smoker -- 0.50 packs/day for 25 years    Types: Cigarettes   . Smokeless tobacco: Never Used  . Alcohol Use: No   OB History   Grav Para Term Preterm Abortions TAB SAB Ect Mult Living                 Review of Systems  Constitutional: Negative for fever.  Musculoskeletal: Positive for arthralgias. Negative for myalgias.  Skin: Positive for wound.  Neurological: Negative for weakness and numbness.      Allergies  Metoclopramide hcl and Tramadol  Home Medications   Prior to Admission medications   Medication Sig Start Date End Date Taking? Authorizing Provider  HYDROcodone-acetaminophen (NORCO) 5-325 MG per tablet Take 1 tablet by mouth every 6 (six) hours as needed for moderate pain or severe pain.    Yes Historical Provider, MD  levothyroxine (SYNTHROID, LEVOTHROID) 125 MCG tablet Take 125 mcg by mouth daily before breakfast.   Yes Historical Provider, MD  Ranitidine HCl (ZANTAC PO) Take 1 tablet by mouth at bedtime as needed (for acid reflux/GERD).   Yes Historical Provider, MD  albuterol-ipratropium (COMBIVENT) 18-103 MCG/ACT inhaler Inhale 2 puffs into the lungs every 4 (four) hours as needed. For shortness of breath    Shawn Rayburn, PA-C  cephALEXin (KEFLEX) 500 MG capsule Take 1 capsule (500 mg total) by mouth 4 (four) times daily. 10/28/13   Burgess AmorJulie Ericberto Padget, PA-C  oxyCODONE-acetaminophen (PERCOCET/ROXICET) 5-325 MG per tablet Take 1 tablet by mouth every 4 (four) hours as needed. 10/28/13   Burgess AmorJulie Moriya Mitchell, PA-C  oxyCODONE-acetaminophen (PERCOCET/ROXICET) 5-325 MG per tablet Take 1 tablet by mouth every 4 (four) hours as needed for moderate pain or  severe pain. 10/28/13   Burgess AmorJulie Yolani Vo, PA-C  predniSONE (DELTASONE) 10 MG tablet Take 2 tablets (20 mg total) by mouth daily. 07/24/13   Benny LennertJoseph L Zammit, MD   BP 128/77  Pulse 95  Temp(Src) 98 F (36.7 C) (Oral)  Resp 20  Ht 4' 10.5" (1.486 m)  Wt 120 lb (54.432 kg)  BMI 24.65 kg/m2  SpO2 100% Physical Exam  Constitutional: She appears well-developed and well-nourished.  HENT:  Head:  Atraumatic.  Neck: Normal range of motion.  Cardiovascular:  Pulses equal bilaterally  Musculoskeletal: She exhibits tenderness.       Left hand: She exhibits bony tenderness. She exhibits normal range of motion. Normal sensation noted.       Hands: Neurological: She is alert. She has normal strength. She displays normal reflexes. No sensory deficit.  Skin: Skin is warm and dry. Laceration noted.  1 cm laceration volar middle phalanx 5th finger.  No visible bone or deep structure.  Subcutaneous wound, hemostatic.  Distal sensation intact.  Psychiatric: She has a normal mood and affect.    ED Course  Procedures (including critical care time)  LACERATION REPAIR Performed by: Burgess AmorIDOL, Mataio Mele Authorized by: Burgess AmorIDOL, Sorrel Cassetta Consent: Verbal consent obtained. Risks and benefits: risks, benefits and alternatives were discussed Consent given by: patient Patient identity confirmed: provided demographic data Prepped and Draped in normal sterile fashion Wound explored  Laceration Location: 5th left finger  Laceration Length: 1cm  No Foreign Bodies seen or palpated  Anesthesia: digital block Local anesthetic: marcaine 0.5%  Anesthetic total: 1 ml  Irrigation method: syringe Amount of cleaning: copious using Safe clens, then copious NS flush Skin closure: 4-0 ethilon  Number of sutures: 3  Technique: simple interrupted, loosely approximated  Patient tolerance: Patient tolerated the procedure well with no immediate complications.  Labs Review Labs Reviewed - No data to display  Imaging Review Dg Finger Little Left  10/27/2013   CLINICAL DATA:  Slammed fifth finger in a door tonight around 9:45 p.m. Pain at the middle phalanx. Laceration to the anterior surface.  EXAM: LEFT LITTLE FINGER 2+V  COMPARISON:  None.  FINDINGS: There is a transverse fracture through the midshaft of the middle phalanx of the left fifth finger with mild dorsal and lateral displacement of the distal fracture  fragments. Associated soft tissue swelling. No intra-articular involvement.  IMPRESSION: Transverse partially displaced fracture of the midshaft of the middle phalanx of the left fifth finger.   Electronically Signed   By: Burman NievesWilliam  Stevens M.D.   On: 10/27/2013 23:41     EKG Interpretation None      MDM   Final diagnoses:  Fracture of finger of left hand, open, initial encounter  Laceration   Patients labs and/or radiological studies were viewed and considered during the medical decision making and disposition process. Pt was placed in a finger splint after wound care and dressing.  Referral to Dr Amanda PeaGramig for appt tomorrow for close f/u - pt to call for appt time.  Discussed the potential complications from an open fracture.  She was placed on keflex,  Given oxycodone for pain.     Burgess AmorJulie Anorah Trias, PA-C 10/28/13 0210

## 2013-10-28 NOTE — ED Provider Notes (Signed)
Medical screening examination/treatment/procedure(s) were performed by non-physician practitioner and as supervising physician I was immediately available for consultation/collaboration.    Dione Boozeavid Rael Yo, MD 10/28/13 [phone]

## 2013-10-28 NOTE — ED Notes (Signed)
Discharge instructions given, pt demonstrated teach back and verbal understanding of splint care and follow up. No concerns voiced.

## 2013-10-28 NOTE — Discharge Instructions (Signed)
Laceration Care, Adult °A laceration is a cut or lesion that goes through all layers of the skin and into the tissue just beneath the skin. °TREATMENT  °Some lacerations may not require closure. Some lacerations may not be able to be closed due to an increased risk of infection. It is important to see your caregiver as soon as possible after an injury to minimize the risk of infection and maximize the opportunity for successful closure. °If closure is appropriate, pain medicines may be given, if needed. The wound will be cleaned to help prevent infection. Your caregiver will use stitches (sutures), staples, wound glue (adhesive), or skin adhesive strips to repair the laceration. These tools bring the skin edges together to allow for faster healing and a better cosmetic outcome. However, all wounds will heal with a scar. Once the wound has healed, scarring can be minimized by covering the wound with sunscreen during the day for 1 full year. °HOME CARE INSTRUCTIONS  °For sutures or staples: °· Keep the wound clean and dry. °· If you were given a bandage (dressing), you should change it at least once a day. Also, change the dressing if it becomes wet or dirty, or as directed by your caregiver. °· Wash the wound with soap and water 2 times a day. Rinse the wound off with water to remove all soap. Pat the wound dry with a clean towel. °· After cleaning, apply a thin layer of the antibiotic ointment as recommended by your caregiver. This will help prevent infection and keep the dressing from sticking. °· You may shower as usual after the first 24 hours. Do not soak the wound in water until the sutures are removed. °· Only take over-the-counter or prescription medicines for pain, discomfort, or fever as directed by your caregiver. °· Get your sutures or staples removed as directed by your caregiver. °For skin adhesive strips: °· Keep the wound clean and dry. °· Do not get the skin adhesive strips wet. You may bathe  carefully, using caution to keep the wound dry. °· If the wound gets wet, pat it dry with a clean towel. °· Skin adhesive strips will fall off on their own. You may trim the strips as the wound heals. Do not remove skin adhesive strips that are still stuck to the wound. They will fall off in time. °For wound adhesive: °· You may briefly wet your wound in the shower or bath. Do not soak or scrub the wound. Do not swim. Avoid periods of heavy perspiration until the skin adhesive has fallen off on its own. After showering or bathing, gently pat the wound dry with a clean towel. °· Do not apply liquid medicine, cream medicine, or ointment medicine to your wound while the skin adhesive is in place. This may loosen the film before your wound is healed. °· If a dressing is placed over the wound, be careful not to apply tape directly over the skin adhesive. This may cause the adhesive to be pulled off before the wound is healed. °· Avoid prolonged exposure to sunlight or tanning lamps while the skin adhesive is in place. Exposure to ultraviolet light in the first year will darken the scar. °· The skin adhesive will usually remain in place for 5 to 10 days, then naturally fall off the skin. Do not pick at the adhesive film. °You may need a tetanus shot if: °· You cannot remember when you had your last tetanus shot. °· You have never had a tetanus   shot. If you get a tetanus shot, your arm may swell, get red, and feel warm to the touch. This is common and not a problem. If you need a tetanus shot and you choose not to have one, there is a rare chance of getting tetanus. Sickness from tetanus can be serious. SEEK MEDICAL CARE IF:   You have redness, swelling, or increasing pain in the wound.  You see a red line that goes away from the wound.  You have yellowish-white fluid (pus) coming from the wound.  You have a fever.  You notice a bad smell coming from the wound or dressing.  Your wound breaks open before or  after sutures have been removed.  You notice something coming out of the wound such as wood or glass.  Your wound is on your hand or foot and you cannot move a finger or toe. SEEK IMMEDIATE MEDICAL CARE IF:   Your pain is not controlled with prescribed medicine.  You have severe swelling around the wound causing pain and numbness or a change in color in your arm, hand, leg, or foot.  Your wound splits open and starts bleeding.  You have worsening numbness, weakness, or loss of function of any joint around or beyond the wound.  You develop painful lumps near the wound or on the skin anywhere on your body. MAKE SURE YOU:   Understand these instructions.  Will watch your condition.  Will get help right away if you are not doing well or get worse. Document Released: 12/25/2004 Document Revised: 03/19/2011 Document Reviewed: 06/20/2010 [facility] Patient Information 2015 [place], [place]. This information is not intended to replace advice given to you by your health care provider. Make sure you discuss any questions you have with your health care provider.  Finger Fracture A finger fracture is when one or more bones in the finger break.  HOME CARE   Wear the splint, tape, or cast as long as told by your doctor.  Keep your fingers in the position your doctor tell you to.  Raise (elevate) the injured area above the level of the heart.  Only take medicine as told by your doctor.  Put ice on the injured area.  Put ice in a plastic bag.  Place a towel between the skin and the bag.  Leave the ice on for 15-20 minutes, 03-04 times a day.  Follow up with your doctor.  Ask what exercises you can do when the splint comes off. GET HELP RIGHT AWAY IF:   The fingernails are white or bluish.  You have pain not helped by medicine.  You cannot move your fingertips.  You lose feeling (numbness) in the injured finger(s). MAKE SURE YOU:   Understand these instructions.  Will watch  this condition.  Will get help right away if you are not doing well or get worse. Document Released: 06/13/2007 Document Revised: 03/19/2011 Document Reviewed: 06/13/2007 [facility] Patient Information 2015 [place], [place]. This information is not intended to replace advice given to you by your health care provider. Make sure you discuss any questions you have with your health care provider.   Keep your finger covered.  Call Dr Amanda PeaGramig in the morning for an appointment time for a recheck of your injury as discussed tomorrow.  You may take the oxycodone prescribed for pain relief.  This will make you drowsy - do not drive within 4 hours of taking this medication.  Take the antibiotic as prescribed.

## 2013-11-02 MED FILL — Oxycodone w/ Acetaminophen Tab 5-325 MG: ORAL | Qty: 6 | Status: AC

## 2013-11-03 ENCOUNTER — Emergency Department (HOSPITAL_COMMUNITY): Payer: Medicaid Other

## 2013-11-03 ENCOUNTER — Emergency Department (HOSPITAL_COMMUNITY)
Admission: EM | Admit: 2013-11-03 | Discharge: 2013-11-03 | Disposition: A | Discharge status: Home / Self Care | Payer: Medicaid Other | Attending: Emergency Medicine | Admitting: Emergency Medicine

## 2013-11-03 ENCOUNTER — Encounter (HOSPITAL_COMMUNITY): Payer: Self-pay | Admitting: Emergency Medicine

## 2013-11-03 DIAGNOSIS — Y939 Activity, unspecified: Secondary | ICD-10-CM | POA: Insufficient documentation

## 2013-11-03 DIAGNOSIS — Y929 Unspecified place or not applicable: Secondary | ICD-10-CM | POA: Insufficient documentation

## 2013-11-03 DIAGNOSIS — S62633A Displaced fracture of distal phalanx of left middle finger, initial encounter for closed fracture: Secondary | ICD-10-CM | POA: Diagnosis not present

## 2013-11-03 DIAGNOSIS — S6992XA Unspecified injury of left wrist, hand and finger(s), initial encounter: Secondary | ICD-10-CM | POA: Diagnosis present

## 2013-11-03 DIAGNOSIS — S62609A Fracture of unspecified phalanx of unspecified finger, initial encounter for closed fracture: Secondary | ICD-10-CM

## 2013-11-03 DIAGNOSIS — T148XXA Other injury of unspecified body region, initial encounter: Secondary | ICD-10-CM

## 2013-11-03 DIAGNOSIS — W231XXA Caught, crushed, jammed, or pinched between stationary objects, initial encounter: Secondary | ICD-10-CM | POA: Insufficient documentation

## 2013-11-03 MED ORDER — HYDROCODONE-ACETAMINOPHEN 5-325 MG PO TABS: 1.0000 | ORAL_TABLET | ORAL | Status: DC | PRN

## 2013-11-03 NOTE — Consult Note (Signed)
NAMDelton [name]:  [name]                 ACCOUNT NO.:  [id number]  MEDICAL RECORD NO.:  [id number]  LOCATION:  TR07C                        FACILITY:  [facility]  PHYSICIAN:  [name], M.D.DATE OF BIRTH:  December 07, 1970  DATE OF CONSULTATION:  11/03/2013 DATE OF DISCHARGE:  11/03/2013                                CONSULTATION   [name].  This patient is a 43 year old female with history of nicotine abuse and other problems, who presents for evaluation and treatment of her hand.  This patient has a left small finger fracture as well as a laceration.  [facility] and referred for further care.  She is here to be seen.  I have reviewed her chart at length and her problem list.  The patient has allergies to METOCLOPRAMIDE and TRAMADOL.  She notes no history of prior injury to the finger and currently has a displaced P2 fracture.  I should note that she has had prior cervical surgery by Dr. Newell [name] in 2013.  I should note that she has had also surgery in the form of hysterectomy.  I have reviewed this with her at length.  At present time, she complains of left small finger pain.  She has a small laceration which was superficial according to her report.  PAST MEDICAL HISTORY:  Reviewed and does include a history of cervical spine injury and fractures with ultimate surgery as noted in the chart. She has a history hypothyroid disease, tobacco abuse, gastroesophageal reflux disease.  ALLERGIES:  Noted above.  MEDICINES:  Are noted in the chart.  The patient and I have discussed these issues at length.  At present time, the patient is employed at [employer].  The patient notes that she does not have a prior history of injury to the small finger.  PHYSICAL EXAMINATION:  HEENT:  Within normal limits.  There are no signs of infection, dystrophy, neurovascular compromise.  Oropharynx is clear. Nasal  passages are clear.  Extraocular movements are intact without any obvious abnormalities or facial asymmetry. CHEST:  Clear, although she does have the smell of nicotine from her smoking. ABDOMEN:  The patient has intact abdomen.  Nontender, nondistended. EXTREMITIES:  Lower extremity examination is benign.  She walks with nonantalgic gait.  Left upper extremity is neurovascularly intact, but does have a P2 fracture, this is a middle phalanx fracture displaced about the left small finger.  I have reviewed this at length.  I have reviewed her prior x-rays as well as x-rays which have been ordered today at our request.  She has intact flexor digitorum profundus function.  Her sensation is intact to the finger.  The patient, on x-ray, has a severely overriding P2 fracture.  I have reviewed this with her at length.  AP and lateral x- rays as well as oblique showed a completely displaced fracture.  I have reviewed these issues with her at length and the findings.  IMPRESSION:  Status post blunt trauma to the finger with small laceration and intact neurovascular structures as well as flexor structures and a notable displaced middle phalanx fracture.  RECOMMENDATIONS:  We have discussed with the patient options and treatment issues.  At present time, we would recommend surgical avenues of care in the form of closed versus open reduction and fixation as necessary.  I have discussed the risks and benefits, timeframe duration of recovery, our biggest concern which simply is going to be stiffness. Given the operating room availability today and accommodating due to emergency, we are going to plan for an elective reconstruction.  In the mean time, I have discussed with her at length the do's and don'ts, timeframe duration of recovery, and other issues as they are to remainder upper extremity predicament.  With these in mind, we will proceed accordingly.  It was a pleasure to [name] her today and  participate in her care.  We look forward to participate in her surgical endeavors and ultimately her postop recovery.  It is a pleasure to [name] her once again.     [name], M.D.     [facility]/MEDQ  D:  11/03/2013  T:  11/03/2013  Job:  [id number]

## 2013-11-03 NOTE — Discharge Instructions (Signed)
Read the information below.  Use the prescribed medication as directed.  Please discuss all new medications with your pharmacist.  Do not take additional tylenol while taking the prescribed pain medication to avoid overdose.  You may return to the Emergency Department at any time for worsening condition or any new symptoms that concern you.  If you develop uncontrolled pain, weakness or numbness of the extremity, severe discoloration of the skin, or you are unable to move your finger, return to the ER for a recheck.   If you develop redness, swelling, pus draining from the wound, or fevers greater than 100.4, return to the ER immediately for a recheck.     Finger Fracture Fractures of fingers are breaks in the bones of the fingers. There are many types of fractures. There are different ways of treating these fractures. Your health care provider will discuss the best way to treat your fracture. CAUSES Traumatic injury is the main cause of broken fingers. These include:  Injuries while playing sports.  Workplace injuries.  Falls. RISK FACTORS Activities that can increase your risk of finger fractures include:  Sports.  Workplace activities that involve machinery.  A condition called osteoporosis, which can make your bones less dense and cause them to fracture more easily. SIGNS AND SYMPTOMS The main symptoms of a broken finger are pain and swelling within 15 minutes after the injury. Other symptoms include:  Bruising of your finger.  Stiffness of your finger.  Numbness of your finger.  Exposed bones (compound fracture) if the fracture is severe. DIAGNOSIS  The best way to diagnose a broken bone is with X-ray imaging. Additionally, your health care provider will use this X-ray image to evaluate the position of the broken finger bones.  TREATMENT  Finger fractures can be treated with:   Nonreduction--This means the bones are in place. The finger is splinted without changing the  positions of the bone pieces. The splint is usually left on for about a week to 10 days. This will depend on your fracture and what your health care provider thinks.  Closed reduction--The bones are put back into position without using surgery. The finger is then splinted.  Open reduction and internal fixation--The fracture site is opened. Then the bone pieces are fixed into place with pins or some type of hardware. This is seldom required. It depends on the severity of the fracture. HOME CARE INSTRUCTIONS   Follow your health care provider's instructions regarding activities, exercises, and physical therapy.  Only take over-the-counter or prescription medicines for pain, discomfort, or fever as directed by your health care provider. SEEK MEDICAL CARE IF: You have pain or swelling that limits the motion or use of your fingers. SEEK IMMEDIATE MEDICAL CARE IF:  Your finger becomes numb. MAKE SURE YOU:   Understand these instructions.  Will watch your condition.  Will get help right away if you are not doing well or get worse. Document Released: 04/08/2000 Document Revised: 10/15/2012 Document Reviewed: 08/06/2012 [facility] Patient Information 2015 [place], [place]LL[place]. This information is not intended to replace advice given to you by your health care provider. Make sure you discuss any questions you have with your health care provider.

## 2013-11-03 NOTE — ED Notes (Signed)
Pt reports injuring her left little finger in door jam last week. Was told by dr Brunetta Generagramigs office to come here today to be seen by him. No acute distress noted at triage.

## 2013-11-03 NOTE — ED Notes (Signed)
Pt moved to fasttrack and paging dr Amanda Peagramig

## 2013-11-03 NOTE — ED Notes (Signed)
Refused wheelchair 

## 2013-11-03 NOTE — ED Provider Notes (Signed)
Medical Screening Exam:  Pt seen in ED 10/21 with fracture and laceration of left 5th finger.  Reports she has had no problems with the laceration.  Has changed bandages regularly and continues to wear the splint.  Taking antibiotics and pain medication as directed.  She is able to move the finger.  Denies any significant pain, or any weakness, numbness, or tingling.  No fevers.    Her left 5th finger has intact sutures.  No erythema, edema, warmth, or discharge.  Able to move at all joints.  Capillary refill < 2 seconds.     Dr Amanda PeaGramig has been notified, is expecting the patient.  Will see pt in ED in approximately 1 hour.  Requests updated xrays.   Dr Amanda PeaGramig has seen and examined the patient in the ED.  Please see his consult note for further details.  Unable to do surgery today due to high volume of emergency surgeries, will schedule for elective surgery later this week.  Will place finger splint and discharge.    Trixie Dredgemily Tilda Samudio, PA-C 11/03/13 1534

## 2013-11-03 NOTE — Progress Notes (Signed)
Orthopedic Tech Progress Note Patient Details:  [name] 1970/07/02 [id number]  Ortho Devices Type of Ortho Device: Finger splint Ortho Device/Splint Location: LUE Ortho Device/Splint Interventions: Ordered;Application   Jennye MoccasinHughes, Adrie Picking Craig 11/03/2013, 4:17 PM

## 2013-11-03 NOTE — ED Notes (Signed)
Ortho paged. 

## 2013-11-04 NOTE — ED Provider Notes (Signed)
Medical screening examination/treatment/procedure(s) were performed by non-physician practitioner and as supervising physician I was immediately available for consultation/collaboration.   EKG Interpretation None       Flint MelterElliott L Eithan Beagle, MD 11/04/13 1106

## 2013-11-06 ENCOUNTER — Encounter (HOSPITAL_COMMUNITY): Payer: Self-pay | Admitting: *Deleted

## 2013-11-06 ENCOUNTER — Other Ambulatory Visit: Payer: Self-pay | Admitting: Orthopedic Surgery

## 2013-11-06 MED ORDER — CEFAZOLIN SODIUM-DEXTROSE 2-3 GM-% IV SOLR
2.0000 g | INTRAVENOUS | Status: AC
  Administered 2013-11-07: 2 g via INTRAVENOUS
  Filled 2013-11-06: qty 50

## 2013-11-06 NOTE — Progress Notes (Signed)
Called with time change. Message left on machine to come in at 815 for 1045 surgery

## 2013-11-07 ENCOUNTER — Encounter (HOSPITAL_COMMUNITY): Payer: Medicaid Other | Admitting: Anesthesiology

## 2013-11-07 ENCOUNTER — Encounter (HOSPITAL_COMMUNITY): Payer: Self-pay | Admitting: Certified Registered Nurse Anesthetist

## 2013-11-07 ENCOUNTER — Ambulatory Visit (HOSPITAL_COMMUNITY)
Admission: RE | Admit: 2013-11-07 | Discharge: 2013-11-07 | Disposition: A | Discharge status: Home / Self Care | Payer: Medicaid Other | Source: Ambulatory Visit | Attending: Orthopedic Surgery | Admitting: Orthopedic Surgery

## 2013-11-07 ENCOUNTER — Ambulatory Visit (HOSPITAL_COMMUNITY): Payer: Medicaid Other | Admitting: Anesthesiology

## 2013-11-07 ENCOUNTER — Encounter (HOSPITAL_COMMUNITY)
Admission: RE | Disposition: A | Discharge status: Home / Self Care | Payer: Self-pay | Source: Ambulatory Visit | Attending: Orthopedic Surgery

## 2013-11-07 DIAGNOSIS — S62607A Fracture of unspecified phalanx of left little finger, initial encounter for closed fracture: Secondary | ICD-10-CM | POA: Diagnosis not present

## 2013-11-07 DIAGNOSIS — Z888 Allergy status to other drugs, medicaments and biological substances status: Secondary | ICD-10-CM | POA: Diagnosis not present

## 2013-11-07 DIAGNOSIS — F17210 Nicotine dependence, cigarettes, uncomplicated: Secondary | ICD-10-CM | POA: Insufficient documentation

## 2013-11-07 DIAGNOSIS — X58XXXA Exposure to other specified factors, initial encounter: Secondary | ICD-10-CM | POA: Diagnosis not present

## 2013-11-07 DIAGNOSIS — Z79899 Other long term (current) drug therapy: Secondary | ICD-10-CM | POA: Diagnosis not present

## 2013-11-07 DIAGNOSIS — E039 Hypothyroidism, unspecified: Secondary | ICD-10-CM | POA: Insufficient documentation

## 2013-11-07 DIAGNOSIS — K219 Gastro-esophageal reflux disease without esophagitis: Secondary | ICD-10-CM | POA: Diagnosis not present

## 2013-11-07 DIAGNOSIS — Z885 Allergy status to narcotic agent status: Secondary | ICD-10-CM | POA: Insufficient documentation

## 2013-11-07 DIAGNOSIS — Y929 Unspecified place or not applicable: Secondary | ICD-10-CM | POA: Insufficient documentation

## 2013-11-07 HISTORY — DX: Adverse effect of unspecified anesthetic, initial encounter: T41.45XA

## 2013-11-07 HISTORY — DX: Anemia, unspecified: D64.9

## 2013-11-07 HISTORY — DX: Unspecified convulsions: R56.9

## 2013-11-07 HISTORY — PX: OPEN REDUCTION INTERNAL FIXATION (ORIF) METACARPAL: SHX6234

## 2013-11-07 LAB — SURGICAL PCR SCREEN
MRSA, PCR: NEGATIVE
Staphylococcus aureus: NEGATIVE

## 2013-11-07 SURGERY — OPEN REDUCTION INTERNAL FIXATION (ORIF) METACARPAL
Anesthesia: General | Site: Finger | Laterality: Left

## 2013-11-07 MED ORDER — PROMETHAZINE HCL 25 MG/ML IJ SOLN: 6.2500 mg | INTRAMUSCULAR | Status: DC | PRN

## 2013-11-07 MED ORDER — FENTANYL CITRATE 0.05 MG/ML IJ SOLN
INTRAMUSCULAR | Status: DC | PRN
  Administered 2013-11-07 (×2): 50 ug via INTRAVENOUS
  Administered 2013-11-07 (×2): 25 ug via INTRAVENOUS

## 2013-11-07 MED ORDER — CEPHALEXIN 500 MG PO CAPS
500.0000 mg | ORAL_CAPSULE | Freq: Four times a day (QID) | ORAL | Status: DC
Start: 2013-11-07 — End: 2015-09-19

## 2013-11-07 MED ORDER — OXYCODONE HCL 5 MG/5ML PO SOLN: 5.0000 mg | Freq: Once | ORAL | Status: AC | PRN

## 2013-11-07 MED ORDER — BUPIVACAINE HCL (PF) 0.25 % IJ SOLN
INTRAMUSCULAR | Status: DC | PRN
  Administered 2013-11-07: 4 mL

## 2013-11-07 MED ORDER — LACTATED RINGERS IV SOLN
INTRAVENOUS | Status: DC | PRN
  Administered 2013-11-07 (×2): via INTRAVENOUS

## 2013-11-07 MED ORDER — FENTANYL CITRATE 0.05 MG/ML IJ SOLN
INTRAMUSCULAR | Status: AC
  Filled 2013-11-07: qty 5

## 2013-11-07 MED ORDER — SODIUM CHLORIDE 0.45 % IV SOLN: INTRAVENOUS | Status: DC

## 2013-11-07 MED ORDER — MIDAZOLAM HCL 2 MG/2ML IJ SOLN
INTRAMUSCULAR | Status: AC
  Filled 2013-11-07: qty 2

## 2013-11-07 MED ORDER — LIDOCAINE HCL (CARDIAC) 20 MG/ML IV SOLN
INTRAVENOUS | Status: AC
Start: 2013-11-07 — End: 2013-11-07
  Filled 2013-11-07: qty 5

## 2013-11-07 MED ORDER — LACTATED RINGERS IV SOLN
INTRAVENOUS | Status: DC
  Administered 2013-11-07: 11:00:00 via INTRAVENOUS

## 2013-11-07 MED ORDER — MUPIROCIN 2 % EX OINT
1.0000 "application " | TOPICAL_OINTMENT | Freq: Once | CUTANEOUS | Status: AC
  Administered 2013-11-07: 1 via TOPICAL
  Filled 2013-11-07: qty 22

## 2013-11-07 MED ORDER — ONDANSETRON HCL 4 MG/2ML IJ SOLN
INTRAMUSCULAR | Status: DC | PRN
  Administered 2013-11-07: 4 mg via INTRAVENOUS

## 2013-11-07 MED ORDER — OXYCODONE HCL 5 MG PO TABS
5.0000 mg | ORAL_TABLET | Freq: Once | ORAL | Status: AC | PRN
  Administered 2013-11-07: 5 mg via ORAL

## 2013-11-07 MED ORDER — EPHEDRINE SULFATE 50 MG/ML IJ SOLN
INTRAMUSCULAR | Status: DC | PRN
  Administered 2013-11-07 (×3): 10 mg via INTRAVENOUS

## 2013-11-07 MED ORDER — ONDANSETRON HCL 4 MG/2ML IJ SOLN
INTRAMUSCULAR | Status: AC
  Filled 2013-11-07: qty 2

## 2013-11-07 MED ORDER — HYDROCODONE-ACETAMINOPHEN 5-325 MG PO TABS
ORAL_TABLET | ORAL | Status: AC
  Administered 2013-11-07: 1 via ORAL
  Filled 2013-11-07: qty 1

## 2013-11-07 MED ORDER — HYDROCODONE-ACETAMINOPHEN 5-325 MG PO TABS: 2.0000 | ORAL_TABLET | Freq: Four times a day (QID) | ORAL | Status: DC | PRN

## 2013-11-07 MED ORDER — BUPIVACAINE HCL (PF) 0.25 % IJ SOLN
INTRAMUSCULAR | Status: AC
  Filled 2013-11-07: qty 10

## 2013-11-07 MED ORDER — DEXAMETHASONE SODIUM PHOSPHATE 4 MG/ML IJ SOLN
INTRAMUSCULAR | Status: AC
  Filled 2013-11-07: qty 1

## 2013-11-07 MED ORDER — MIDAZOLAM HCL 5 MG/5ML IJ SOLN
INTRAMUSCULAR | Status: DC | PRN
  Administered 2013-11-07: 2 mg via INTRAVENOUS

## 2013-11-07 MED ORDER — GLYCOPYRROLATE 0.2 MG/ML IJ SOLN
INTRAMUSCULAR | Status: DC | PRN
  Administered 2013-11-07: 0.2 mg via INTRAVENOUS

## 2013-11-07 MED ORDER — HYDROMORPHONE HCL 1 MG/ML IJ SOLN: 0.2500 mg | INTRAMUSCULAR | Status: DC | PRN

## 2013-11-07 MED ORDER — LIDOCAINE HCL 1 % IJ SOLN
INTRAMUSCULAR | Status: DC | PRN
  Administered 2013-11-07: 80 mg via INTRADERMAL

## 2013-11-07 MED ORDER — ARTIFICIAL TEARS OP OINT
TOPICAL_OINTMENT | OPHTHALMIC | Status: AC
  Filled 2013-11-07: qty 3.5

## 2013-11-07 MED ORDER — CHLORHEXIDINE GLUCONATE 4 % EX LIQD
60.0000 mL | Freq: Once | CUTANEOUS | Status: DC
  Filled 2013-11-07: qty 60

## 2013-11-07 MED ORDER — SUCCINYLCHOLINE CHLORIDE 20 MG/ML IJ SOLN
INTRAMUSCULAR | Status: AC
  Filled 2013-11-07: qty 2

## 2013-11-07 MED ORDER — DEXAMETHASONE SODIUM PHOSPHATE 4 MG/ML IJ SOLN
INTRAMUSCULAR | Status: DC | PRN
  Administered 2013-11-07: 4 mg via INTRAVENOUS

## 2013-11-07 MED ORDER — ARTIFICIAL TEARS OP OINT
TOPICAL_OINTMENT | OPHTHALMIC | Status: DC | PRN
  Administered 2013-11-07: 1 via OPHTHALMIC

## 2013-11-07 MED ORDER — HYDROCODONE-ACETAMINOPHEN 5-325 MG PO TABS
1.0000 | ORAL_TABLET | Freq: Once | ORAL | Status: AC
  Administered 2013-11-07: 1 via ORAL

## 2013-11-07 MED ORDER — OXYCODONE HCL 5 MG PO TABS
ORAL_TABLET | ORAL | Status: AC
  Filled 2013-11-07: qty 1

## 2013-11-07 MED ORDER — PROPOFOL 10 MG/ML IV BOLUS
INTRAVENOUS | Status: DC | PRN
  Administered 2013-11-07: 130 mg via INTRAVENOUS

## 2013-11-07 MED ORDER — PROPOFOL 10 MG/ML IV BOLUS
INTRAVENOUS | Status: AC
  Filled 2013-11-07: qty 20

## 2013-11-07 SURGICAL SUPPLY — 50 items
BANDAGE COBAN STERILE 2 (GAUZE/BANDAGES/DRESSINGS) ×2 IMPLANT
BANDAGE ELASTIC 3 VELCRO ST LF (GAUZE/BANDAGES/DRESSINGS) ×3 IMPLANT
BANDAGE ELASTIC 4 VELCRO ST LF (GAUZE/BANDAGES/DRESSINGS) ×3 IMPLANT
BANDAGE GAUZE 4  KLING STR (GAUZE/BANDAGES/DRESSINGS) ×2 IMPLANT
BLADE SURG ROTATE 9660 (MISCELLANEOUS) IMPLANT
BNDG CMPR 9X4 STRL LF SNTH (GAUZE/BANDAGES/DRESSINGS) ×1
BNDG ESMARK 4X9 LF (GAUZE/BANDAGES/DRESSINGS) ×3 IMPLANT
BNDG GAUZE ELAST 4 BULKY (GAUZE/BANDAGES/DRESSINGS) ×9 IMPLANT
CORDS BIPOLAR (ELECTRODE) ×3 IMPLANT
COVER SURGICAL LIGHT HANDLE (MISCELLANEOUS) ×3 IMPLANT
CUFF TOURNIQUET SINGLE 18IN (TOURNIQUET CUFF) ×3 IMPLANT
CUFF TOURNIQUET SINGLE 24IN (TOURNIQUET CUFF) IMPLANT
DRAIN TLS ROUND 10FR (DRAIN) IMPLANT
DRAPE OEC MINIVIEW 54X84 (DRAPES) ×2 IMPLANT
DRAPE SURG 17X23 STRL (DRAPES) ×3 IMPLANT
DRSG EMULSION OIL 3X3 NADH (GAUZE/BANDAGES/DRESSINGS) ×2 IMPLANT
GAUZE SPONGE 4X4 12PLY STRL (GAUZE/BANDAGES/DRESSINGS) ×3 IMPLANT
GAUZE XEROFORM 1X8 LF (GAUZE/BANDAGES/DRESSINGS) ×3 IMPLANT
GLOVE BIOGEL M STRL SZ7.5 (GLOVE) ×3 IMPLANT
GLOVE SS BIOGEL STRL SZ 8 (GLOVE) ×1 IMPLANT
GLOVE SUPERSENSE BIOGEL SZ 8 (GLOVE) ×2
GOWN STRL REUS W/ TWL LRG LVL3 (GOWN DISPOSABLE) ×3 IMPLANT
GOWN STRL REUS W/ TWL XL LVL3 (GOWN DISPOSABLE) ×3 IMPLANT
GOWN STRL REUS W/TWL LRG LVL3 (GOWN DISPOSABLE) ×9
GOWN STRL REUS W/TWL XL LVL3 (GOWN DISPOSABLE) ×9
KIT BASIN OR (CUSTOM PROCEDURE TRAY) ×3 IMPLANT
KIT ROOM TURNOVER OR (KITS) ×3 IMPLANT
LOOP VESSEL MAXI BLUE (MISCELLANEOUS) IMPLANT
MANIFOLD NEPTUNE II (INSTRUMENTS) ×3 IMPLANT
NEEDLE 22X1 1/2 (OR ONLY) (NEEDLE) IMPLANT
NS IRRIG 1000ML POUR BTL (IV SOLUTION) ×3 IMPLANT
PACK ORTHO EXTREMITY (CUSTOM PROCEDURE TRAY) ×3 IMPLANT
PAD ARMBOARD 7.5X6 YLW CONV (MISCELLANEOUS) ×6 IMPLANT
PAD CAST 3X4 CTTN HI CHSV (CAST SUPPLIES) ×1 IMPLANT
PAD CAST 4YDX4 CTTN HI CHSV (CAST SUPPLIES) ×1 IMPLANT
PADDING CAST COTTON 3X4 STRL (CAST SUPPLIES) ×3
PADDING CAST COTTON 4X4 STRL (CAST SUPPLIES) ×3
SPONGE LAP 4X18 X RAY DECT (DISPOSABLE) IMPLANT
SUT MNCRL AB 4-0 PS2 18 (SUTURE) ×3 IMPLANT
SUT PROLENE 3 0 PS 2 (SUTURE) IMPLANT
SUT VIC AB 3-0 FS2 27 (SUTURE) IMPLANT
SYR CONTROL 10ML LL (SYRINGE) IMPLANT
SYSTEM CHEST DRAIN TLS 7FR (DRAIN) IMPLANT
TOWEL OR 17X24 6PK STRL BLUE (TOWEL DISPOSABLE) ×3 IMPLANT
TOWEL OR 17X26 10 PK STRL BLUE (TOWEL DISPOSABLE) ×3 IMPLANT
TUBE CONNECTING 12'X1/4 (SUCTIONS) ×1
TUBE CONNECTING 12X1/4 (SUCTIONS) ×2 IMPLANT
TUBE EVACUATION TLS (MISCELLANEOUS) ×3 IMPLANT
WATER STERILE IRR 1000ML POUR (IV SOLUTION) ×3 IMPLANT
WIRE K .035MM 164206035 (MISCELLANEOUS) ×2 IMPLANT

## 2013-11-07 NOTE — Anesthesia Procedure Notes (Signed)
Procedure Name: LMA Insertion Date/Time: 11/07/2013 2:11 PM Performed by: Angelica PouSMITH, Shakala Marlatt PIZZICARA Pre-anesthesia Checklist: Patient identified, Timeout performed, Emergency Drugs available, Suction available and Patient being monitored Patient Re-evaluated:Patient Re-evaluated prior to inductionOxygen Delivery Method: Circle system utilized Preoxygenation: Pre-oxygenation with 100% oxygen Intubation Type: IV induction Ventilation: Mask ventilation without difficulty LMA: LMA inserted LMA Size: 4.0 Number of attempts: 1 Tube secured with: Tape Dental Injury: Teeth and Oropharynx as per pre-operative assessment

## 2013-11-07 NOTE — Discharge Instructions (Signed)
You did very well with your surgery.  Please take the antibiotics until they are complete.  Please notify should and problems occur.  We will last that you see us in 10 days in the office and do not remove the bandage until then.  Keep bandage clean and dry.  Call for any problems.  No smoking.  Criteria for driving a car: you should be off your pain medicine for 7-8 hours, able to drive one handed(confident), thinking clearly and feeling able in your judgement to drive. Continue elevation as it will decrease swelling.  If instructed by MD move your fingers within the confines of the bandage/splint.  Use ice if instructed by your MD. Call immediately for any sudden loss of feeling in your hand/arm or change in functional abilities of the extremity.  We recommend that you to take vitamin C 1000 mg a day to promote healing we also recommend that if you require her pain medicine that he take a stool softener to prevent constipation as most pain medicines will have constipation side effects. We recommend either Peri-Colace or Senokot and recommend that you also consider adding MiraLAX to prevent the constipation affects from pain medicine if you are required to use them. These medicines are over the counter and maybe purchased at a local pharmacy.

## 2013-11-07 NOTE — Transfer of Care (Signed)
Immediate Anesthesia Transfer of Care Note  Patient: [name]  Procedure(s) Performed: Procedure(s): OPEN REDUCTION INTERNAL FIXATION (ORIF) LEFT SMALL FINGER FRACTURE  (Left)  Patient Location: PACU  Anesthesia Type:General  Level of Consciousness: awake, alert , oriented and patient cooperative  Airway & Oxygen Therapy: Patient Spontanous Breathing and Patient connected to nasal cannula oxygen  Post-op Assessment: Report given to PACU RN, Post -op Vital signs reviewed and stable and Patient moving all extremities  Post vital signs: Reviewed and stable  Complications: No apparent anesthesia complications
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
## 2013-11-07 NOTE — Anesthesia Postprocedure Evaluation (Signed)
  Anesthesia Post-op Note  Patient: [name]  Procedure(s) Performed: Procedure(s): OPEN REDUCTION INTERNAL FIXATION (ORIF) LEFT SMALL FINGER FRACTURE  (Left)  Patient Location: PACU  Anesthesia Type:General  Level of Consciousness: awake, alert  and oriented  Airway and Oxygen Therapy: Patient Spontanous Breathing  Post-op Pain: none  Post-op Assessment: Post-op Vital signs reviewed  Post-op Vital Signs: Reviewed  Last Vitals:  Filed Vitals:   11/07/13 1513  BP: 116/71  Pulse: 76  Temp:   Resp: 12    Complications: No apparent anesthesia complications

## 2013-11-07 NOTE — Op Note (Signed)
See ZOXWRUEAV#[id number]#[id number] Amanda PeaGramig MD

## 2013-11-07 NOTE — Progress Notes (Signed)
Called patient states she did not get message and will be here in approximately one hour

## 2013-11-07 NOTE — Anesthesia Preprocedure Evaluation (Addendum)
Anesthesia Evaluation  Patient identified by MRN, date of birth, ID band Patient awake    Reviewed: Allergy & Precautions, H&P , NPO status , Patient's Chart, lab work & pertinent test results  Airway Mallampati: I  TM Distance: >3 FB Neck ROM: Full    Dental  (+) Teeth Intact, Dental Advisory Given   Pulmonary Current Smoker,          Cardiovascular negative cardio ROS      Neuro/Psych negative neurological ROS  negative psych ROS   GI/Hepatic Neg liver ROS, GERD-  ,  Endo/Other  Hypothyroidism   Renal/GU negative Renal ROS     Musculoskeletal negative musculoskeletal ROS (+)   Abdominal   Peds  Hematology negative hematology ROS (+)   Anesthesia Other Findings   Reproductive/Obstetrics                            Anesthesia Physical Anesthesia Plan  ASA: II  Anesthesia Plan: General   Post-op Pain Management:    Induction: Intravenous  Airway Management Planned: LMA  Additional Equipment:   Intra-op Plan:   Post-operative Plan:   Informed Consent: I have reviewed the patients History and Physical, chart, labs and discussed the procedure including the risks, benefits and alternatives for the proposed anesthesia with the patient or authorized representative who has indicated his/her understanding and acceptance.   Dental advisory given  Plan Discussed with: CRNA and Surgeon  Anesthesia Plan Comments:         Anesthesia Quick Evaluation

## 2013-11-09 ENCOUNTER — Encounter (HOSPITAL_COMMUNITY): Payer: Self-pay | Admitting: Orthopedic Surgery

## 2013-11-11 NOTE — Op Note (Signed)
NAMDelton See:  [name], [name]                 ACCOUNT NO.:  [id number]  MEDICAL RECORD NO.:  [id number]  LOCATION:                               FACILITY:  [facility]  PHYSICIAN:  [name], M.D.DATE OF BIRTH:  [birth year]  DATE OF PROCEDURE:  11/07/2013 DATE OF DISCHARGE:  11/07/2013                              OPERATIVE REPORT   PREOPERATIVE DIAGNOSIS:  Left small finger displaced, middle phalanx fracture with associated laceration volarly.  The patient presents for I and D and repair of the bony architecture.  POSTOPERATIVE DIAGNOSIS:  Left small finger displaced, middle phalanx fracture with associated laceration volarly. The patient presents for I and D and repair of the bony architecture.  PROCEDURE: 1. Closed reduction and pinning, middle phalanx fracture, left small     finger. 2. X-ray, AP, lateral, and oblique x-rays about the hand performed,     examined, and interpreted by myself. 3. I and D skin, subcutaneous tissue, volar left hand with suture     removal from prior suture placement.  SURGEON:  Brooke AnoWilliam M. [name], M.D.  ASSISTANT:  None.  COMPLICATIONS:  None.  ANESTHESIA:  General.  TOURNIQUET TIME:  Zero.  INDICATIONS:  Pleasant female, who presents with a completely displaced fracture.  She desires to proceed the above-mentioned operative intervention.  I have counseled her in regard to risks and benefits of the surgery and will proceed accordingly.  OPERATION IN DETAIL:  The patient was seen by myself and Anesthesia, taken to the operative suite, and underwent smooth induction of anesthesia in the form of a general anesthetic.  She was laid supine, fully padded, prepped and draped in the usual sterile fashion.  Betadine scrub and paint about the left upper extremity.  Following this, time- out was called.  Once this was complete, we then performed removal of the sutures volarly.  I explored this.  I performed I and D of skin and subcutaneous tissue, all  looked well and there were no signs of infection.  I was quite pleased to see this.  Once this was done, I then performed a very careful and cautious manipulation of the fracture.  The fracture was manipulated and was quite difficult given the timeframe duration from injury to reduce but I was able to reduce it to my satisfaction and placed a 0.035 K-wire across the DIP joint entering distally at the pulp of the finger crossing the DIP joint and then the fracture site.  I pulled it back, preclipped it, and then seated it against the bone.  This done without difficulty.  The patient tolerated this well.  I seated the pin for later deep pin removal.  AP, lateral, and oblique x-rays were performed, examined, and interpreted by myself, looked excellent.  I took before and after pictures of the fracture and was quite pleased with our fixation.  Once this was done, I placed chromic over the entrance site for the pin. Irrigated copiously.  I trimmed her nails and made sure that all wound parameters and condition looked excellent.  I was pleased this in the findings.  The patient tolerated this well.  She was given an intermetacarpal block and following  this, dressed with Adaptic, Xeroform, sterile gauze, and finger splint.  We will see her back in the office in 10-14 days, 4 weeks of immobilization, and consideration for pin removal will be begun.  It was a pleasure to see her today and participate in her care.  DISCHARGE MEDICINES:  Norco and Keflex.     Brooke AnoWilliam M. [name], M.D.     [facility]  D:  11/07/2013  T:  11/07/2013  Job:  [id number]

## 2014-09-07 ENCOUNTER — Encounter (HOSPITAL_COMMUNITY): Payer: Self-pay | Admitting: *Deleted

## 2014-09-07 ENCOUNTER — Emergency Department (HOSPITAL_COMMUNITY)
Admission: EM | Admit: 2014-09-07 | Discharge: 2014-09-07 | Disposition: A | Discharge status: Home / Self Care | Payer: Medicaid Other | Attending: Emergency Medicine | Admitting: Emergency Medicine

## 2014-09-07 ENCOUNTER — Emergency Department (HOSPITAL_COMMUNITY): Payer: Medicaid Other

## 2014-09-07 DIAGNOSIS — S2241XA Multiple fractures of ribs, right side, initial encounter for closed fracture: Secondary | ICD-10-CM | POA: Insufficient documentation

## 2014-09-07 DIAGNOSIS — W108XXA Fall (on) (from) other stairs and steps, initial encounter: Secondary | ICD-10-CM | POA: Diagnosis not present

## 2014-09-07 DIAGNOSIS — Y998 Other external cause status: Secondary | ICD-10-CM | POA: Insufficient documentation

## 2014-09-07 DIAGNOSIS — Z8659 Personal history of other mental and behavioral disorders: Secondary | ICD-10-CM | POA: Diagnosis not present

## 2014-09-07 DIAGNOSIS — R52 Pain, unspecified: Secondary | ICD-10-CM

## 2014-09-07 DIAGNOSIS — S299XXA Unspecified injury of thorax, initial encounter: Secondary | ICD-10-CM | POA: Diagnosis present

## 2014-09-07 DIAGNOSIS — Z79899 Other long term (current) drug therapy: Secondary | ICD-10-CM | POA: Diagnosis not present

## 2014-09-07 DIAGNOSIS — Z862 Personal history of diseases of the blood and blood-forming organs and certain disorders involving the immune mechanism: Secondary | ICD-10-CM | POA: Insufficient documentation

## 2014-09-07 DIAGNOSIS — Z8719 Personal history of other diseases of the digestive system: Secondary | ICD-10-CM | POA: Diagnosis not present

## 2014-09-07 DIAGNOSIS — E039 Hypothyroidism, unspecified: Secondary | ICD-10-CM | POA: Diagnosis not present

## 2014-09-07 DIAGNOSIS — S2239XA Fracture of one rib, unspecified side, initial encounter for closed fracture: Secondary | ICD-10-CM

## 2014-09-07 DIAGNOSIS — Z720 Tobacco use: Secondary | ICD-10-CM | POA: Insufficient documentation

## 2014-09-07 DIAGNOSIS — Y9389 Activity, other specified: Secondary | ICD-10-CM | POA: Diagnosis not present

## 2014-09-07 DIAGNOSIS — Y9289 Other specified places as the place of occurrence of the external cause: Secondary | ICD-10-CM | POA: Insufficient documentation

## 2014-09-07 MED ORDER — HYDROCODONE-ACETAMINOPHEN 5-325 MG PO TABS
1.0000 | ORAL_TABLET | Freq: Once | ORAL | Status: AC
  Administered 2014-09-07: 1 via ORAL
  Filled 2014-09-07: qty 1

## 2014-09-07 MED ORDER — HYDROCODONE-ACETAMINOPHEN 5-325 MG PO TABS: 1.0000 | ORAL_TABLET | ORAL | Status: DC | PRN

## 2014-09-07 NOTE — Discharge Instructions (Signed)
Rib Fracture °A rib fracture is a break or crack in one of the bones of the ribs. The ribs are a group of long, curved bones that wrap around your chest and attach to your spine. They protect your lungs and other organs in the chest cavity. A broken or cracked rib is often painful, but most do not cause other problems. Most rib fractures heal on their own over time. However, rib fractures can be more serious if multiple ribs are broken or if broken ribs move out of place and push against other structures. °CAUSES  °· A direct blow to the chest. For example, this could happen during contact sports, a car accident, or a fall against a hard object. °· Repetitive movements with high force, such as pitching a baseball or having severe coughing spells. °SYMPTOMS  °· Pain when you breathe in or cough. °· Pain when someone presses on the injured area. °DIAGNOSIS  °Your caregiver will perform a physical exam. Various imaging tests may be ordered to confirm the diagnosis and to look for related injuries. These tests may include a chest X-ray, computed tomography (CT), magnetic resonance imaging (MRI), or a bone scan. °TREATMENT  °Rib fractures usually heal on their own in 1-3 months. The longer healing period is often associated with a continued cough or other aggravating activities. During the healing period, pain control is very important. Medication is usually given to control pain. Hospitalization or surgery may be needed for more severe injuries, such as those in which multiple ribs are broken or the ribs have moved out of place.  °HOME CARE INSTRUCTIONS  °· Avoid strenuous activity and any activities or movements that cause pain. Be careful during activities and avoid bumping the injured rib. °· Gradually increase activity as directed by your caregiver. °· Only take over-the-counter or prescription medications as directed by your caregiver. Do not take other medications without asking your caregiver first. °· Apply ice  to the injured area for the first 1-2 days after you have been treated or as directed by your caregiver. Applying ice helps to reduce inflammation and pain. °¨ Put ice in a plastic bag. °¨ Place a towel between your skin and the bag.   °¨ Leave the ice on for 15-20 minutes at a time, every 2 hours while you are awake. °· Perform deep breathing as directed by your caregiver. This will help prevent pneumonia, which is a common complication of a broken rib. Your caregiver may instruct you to: °¨ Take deep breaths several times a day. °¨ Try to cough several times a day, holding a pillow against the injured area. °¨ Use a device called an incentive spirometer to practice deep breathing several times a day. °· Drink enough fluids to keep your urine clear or pale yellow. This will help you avoid constipation.   °· Do not wear a rib belt or binder. These restrict breathing, which can lead to pneumonia.   °SEEK IMMEDIATE MEDICAL CARE IF:  °· You have a fever.   °· You have difficulty breathing or shortness of breath.   °· You develop a continual cough, or you cough up thick or bloody sputum. °· You feel sick to your stomach (nausea), throw up (vomit), or have abdominal pain.   °· You have worsening pain not controlled with medications.   °MAKE SURE YOU: °· Understand these instructions. °· Will watch your condition. °· Will get help right away if you are not doing well or get worse. °Document Released: 12/25/2004 Document Revised: 08/27/2012 Document Reviewed:   02/27/2012 °Exit[employer]® Patient Information ©2015 [employer]. This information is not intended to replace advice given to you by your health care provider. Make sure you discuss any questions you have with your health care provider. ° °Rib Belt °Your caregiver has given you a rib belt to help control the pain from a chest injury. This belt gives gentle support to the injured area. It also helps reduce chest motion. To apply your rib belt, place it across your back  and stretch the ends out and forward across your rib cage. Press the Velcro areas together when there is a gentle pressure on the chest wall. Do not make the rib belt too tight in order to breathe comfortably. °If you are older or have lung disease, rib belts can increase the risk of getting pneumonia after a chest injury. You must be sure to breathe deeply and cough several times every hour to keep your lungs clear. Do not use a rib belt if it does not help relieve your pain. Take it off at night when you go to bed. Most rib belts can be washed by machine and hung dry. °SEEK IMMEDIATE MEDICAL CARE IF: °· You develop purulent (pus like) sputum or an uncontrolled cough. °· You begin coughing up blood. °· You develop pain which is getting worse or is uncontrolled with medications. °· You have a fever. °· Any of the symptoms which brought you in for treatment are getting worse rather than better, or you develop shortness of breath or chest pain. Dial 911 for immediate emergency care. °Document Released: 02/02/2004 Document Revised: 03/19/2011 Document Reviewed: 12/25/2004 °Exit[employer]® Patient Information ©2015 [employer]. This information is not intended to replace advice given to you by your health care provider. Make sure you discuss any questions you have with your health care provider. ° °

## 2014-09-07 NOTE — ED Notes (Signed)
Pt ambulated around nursing desk with no difficulties. Pt states she feels better after eating and drinking. HR is 64 on discharge from hospital. NAD noted.   Pt verbalizes that she is not driving and that her mother is picking her up.

## 2014-09-07 NOTE — ED Notes (Signed)
Pt states she had a fall at work 3 weeks ago, hitting her abdomen. Since them both her left and right rib cage have been hurting. Pt denies any SOB. NAD noted.

## 2014-09-07 NOTE — ED Notes (Signed)
Checked vitals on pt prior to discharge and noted pt's hr in upper 40's and lower 50's.  Pt says usually her HR isn't that slow and reports has been feeling lightheaded.  Pt says she thought the lightheadedness came from not eating today.  Notified J. Idol PA and was instructed to obtain EKG, give pt a snack, walk her around the dept then recheck vitals.  Pt notified and verbalized understanding.
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
## 2014-09-13 ENCOUNTER — Encounter (HOSPITAL_COMMUNITY): Payer: Self-pay | Admitting: Emergency Medicine

## 2014-09-13 ENCOUNTER — Emergency Department (HOSPITAL_COMMUNITY): Payer: Medicaid Other

## 2014-09-13 DIAGNOSIS — Y929 Unspecified place or not applicable: Secondary | ICD-10-CM | POA: Insufficient documentation

## 2014-09-13 DIAGNOSIS — W541XXA Struck by dog, initial encounter: Secondary | ICD-10-CM | POA: Insufficient documentation

## 2014-09-13 DIAGNOSIS — S6991XA Unspecified injury of right wrist, hand and finger(s), initial encounter: Secondary | ICD-10-CM | POA: Diagnosis present

## 2014-09-13 DIAGNOSIS — Z720 Tobacco use: Secondary | ICD-10-CM | POA: Insufficient documentation

## 2014-09-13 DIAGNOSIS — Y998 Other external cause status: Secondary | ICD-10-CM | POA: Insufficient documentation

## 2014-09-13 DIAGNOSIS — S29001A Unspecified injury of muscle and tendon of front wall of thorax, initial encounter: Secondary | ICD-10-CM | POA: Insufficient documentation

## 2014-09-13 DIAGNOSIS — Y939 Activity, unspecified: Secondary | ICD-10-CM | POA: Insufficient documentation

## 2014-09-13 NOTE — ED Notes (Signed)
Pt c/o fall tonight by tripping over dog. Pt c/o left rib pain and rt wrist pain.

## 2014-09-14 ENCOUNTER — Emergency Department (HOSPITAL_COMMUNITY)
Admission: EM | Admit: 2014-09-14 | Discharge: 2014-09-14 | Discharge status: Against Medical Advice | Payer: Medicaid Other | Attending: Emergency Medicine | Admitting: Emergency Medicine

## 2014-09-14 ENCOUNTER — Ambulatory Visit (HOSPITAL_COMMUNITY): Admission: RE | Admit: 2014-09-14 | Payer: No Typology Code available for payment source | Source: Ambulatory Visit

## 2014-09-14 NOTE — ED Notes (Signed)
Per registration pt left the facility 

## 2015-09-19 ENCOUNTER — Emergency Department (HOSPITAL_COMMUNITY)
Admission: EM | Admit: 2015-09-19 | Discharge: 2015-09-19 | Disposition: A | Discharge status: Home / Self Care | Payer: Medicaid Other | Attending: Emergency Medicine | Admitting: Emergency Medicine

## 2015-09-19 ENCOUNTER — Emergency Department (HOSPITAL_COMMUNITY): Payer: Medicaid Other

## 2015-09-19 ENCOUNTER — Encounter (HOSPITAL_COMMUNITY): Payer: Self-pay | Admitting: Emergency Medicine

## 2015-09-19 DIAGNOSIS — E039 Hypothyroidism, unspecified: Secondary | ICD-10-CM | POA: Insufficient documentation

## 2015-09-19 DIAGNOSIS — M542 Cervicalgia: Secondary | ICD-10-CM

## 2015-09-19 DIAGNOSIS — Y939 Activity, unspecified: Secondary | ICD-10-CM | POA: Diagnosis not present

## 2015-09-19 DIAGNOSIS — S161XXA Strain of muscle, fascia and tendon at neck level, initial encounter: Secondary | ICD-10-CM | POA: Diagnosis not present

## 2015-09-19 DIAGNOSIS — Y929 Unspecified place or not applicable: Secondary | ICD-10-CM | POA: Diagnosis not present

## 2015-09-19 DIAGNOSIS — S199XXA Unspecified injury of neck, initial encounter: Secondary | ICD-10-CM | POA: Diagnosis present

## 2015-09-19 DIAGNOSIS — X58XXXA Exposure to other specified factors, initial encounter: Secondary | ICD-10-CM | POA: Insufficient documentation

## 2015-09-19 DIAGNOSIS — Y999 Unspecified external cause status: Secondary | ICD-10-CM | POA: Diagnosis not present

## 2015-09-19 DIAGNOSIS — F17210 Nicotine dependence, cigarettes, uncomplicated: Secondary | ICD-10-CM | POA: Diagnosis not present

## 2015-09-19 MED ORDER — PREDNISONE 10 MG PO TABS
ORAL_TABLET | ORAL | 0 refills | Status: DC
Start: 2015-09-19 — End: 2023-05-02

## 2015-09-19 MED ORDER — OXYCODONE-ACETAMINOPHEN 5-325 MG PO TABS: 2.0000 | ORAL_TABLET | ORAL | 0 refills | Status: DC | PRN

## 2015-09-19 NOTE — ED Notes (Signed)
[name] transported to x-ray.
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
## 2015-09-19 NOTE — ED Triage Notes (Signed)
Patient states she has a history of back surgery and has had neck and back pain since last Wednesday. NAD.
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
## 2016-06-28 ENCOUNTER — Ambulatory Visit (HOSPITAL_COMMUNITY): Payer: Self-pay | Admitting: Psychiatry

## 2016-10-29 ENCOUNTER — Emergency Department (HOSPITAL_COMMUNITY): Payer: Medicaid Other

## 2016-10-29 ENCOUNTER — Encounter (HOSPITAL_COMMUNITY): Payer: Self-pay

## 2016-10-29 ENCOUNTER — Emergency Department (HOSPITAL_COMMUNITY)
Admission: EM | Admit: 2016-10-29 | Discharge: 2016-10-29 | Disposition: A | Discharge status: Home / Self Care | Payer: Medicaid Other | Attending: Emergency Medicine | Admitting: Emergency Medicine

## 2016-10-29 DIAGNOSIS — F1010 Alcohol abuse, uncomplicated: Secondary | ICD-10-CM | POA: Diagnosis not present

## 2016-10-29 DIAGNOSIS — E039 Hypothyroidism, unspecified: Secondary | ICD-10-CM | POA: Insufficient documentation

## 2016-10-29 DIAGNOSIS — F17210 Nicotine dependence, cigarettes, uncomplicated: Secondary | ICD-10-CM | POA: Insufficient documentation

## 2016-10-29 DIAGNOSIS — Z79899 Other long term (current) drug therapy: Secondary | ICD-10-CM | POA: Insufficient documentation

## 2016-10-29 DIAGNOSIS — R55 Syncope and collapse: Secondary | ICD-10-CM

## 2016-10-29 DIAGNOSIS — M542 Cervicalgia: Secondary | ICD-10-CM | POA: Insufficient documentation

## 2016-10-29 DIAGNOSIS — W19XXXA Unspecified fall, initial encounter: Secondary | ICD-10-CM

## 2016-10-29 DIAGNOSIS — R51 Headache: Secondary | ICD-10-CM | POA: Diagnosis not present

## 2016-10-29 LAB — CBC
HCT: 36.7 % (ref 36.0–46.0)
Hemoglobin: 11.9 g/dL — ABNORMAL LOW (ref 12.0–15.0)
MCH: 30.5 pg (ref 26.0–34.0)
MCHC: 32.4 g/dL (ref 30.0–36.0)
MCV: 94.1 fL (ref 78.0–100.0)
PLATELETS: 265 10*3/uL (ref 150–400)
RBC: 3.9 MIL/uL (ref 3.87–5.11)
RDW: 14.4 % (ref 11.5–15.5)
WBC: 6.4 10*3/uL (ref 4.0–10.5)

## 2016-10-29 LAB — BASIC METABOLIC PANEL
ANION GAP: 8 (ref 5–15)
BUN: 8 mg/dL (ref 6–20)
CO2: 27 mmol/L (ref 22–32)
Calcium: 8.8 mg/dL — ABNORMAL LOW (ref 8.9–10.3)
Chloride: 105 mmol/L (ref 101–111)
Creatinine, Ser: 0.73 mg/dL (ref 0.44–1.00)
GFR calc Af Amer: >60 mL/min (ref >60)
Glucose, Bld: 86 mg/dL (ref 65–99)
Potassium: 3.2 mmol/L — ABNORMAL LOW (ref 3.5–5.1)
SODIUM: 140 mmol/L (ref 135–145)

## 2016-10-29 LAB — ETHANOL: ALCOHOL ETHYL (B): 151 mg/dL — AB (ref <10)

## 2016-10-29 MED ORDER — POTASSIUM CHLORIDE CRYS ER 20 MEQ PO TBCR
40.0000 meq | EXTENDED_RELEASE_TABLET | Freq: Once | ORAL | Status: AC
  Administered 2016-10-29: 40 meq via ORAL
  Filled 2016-10-29: qty 2

## 2016-10-29 MED ORDER — SODIUM CHLORIDE 0.9 % IV BOLUS (SEPSIS)
1000.0000 mL | Freq: Once | INTRAVENOUS | Status: AC
  Administered 2016-10-29: 1000 mL via INTRAVENOUS

## 2016-10-29 MED ORDER — IBUPROFEN 400 MG PO TABS
600.0000 mg | ORAL_TABLET | Freq: Once | ORAL | Status: AC
  Administered 2016-10-29: 600 mg via ORAL
  Filled 2016-10-29: qty 1

## 2016-10-29 NOTE — Discharge Instructions (Signed)
As discussed, Please follow up with your primary care provider and discuss management of alcohol abuse. Stay well-hydrated and make sure that you eat regular balanced meals.  Return if you experience a bad headache, nausea, vomiting, visual changes or any new concerning symptoms in the meantime.
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
## 2016-10-29 NOTE — ED Notes (Signed)
Pt departed in NAD, refused use of wheelchair.  

## 2017-06-03 DIAGNOSIS — F321 Major depressive disorder, single episode, moderate: Secondary | ICD-10-CM | POA: Insufficient documentation

## 2017-06-03 DIAGNOSIS — F411 Generalized anxiety disorder: Secondary | ICD-10-CM | POA: Insufficient documentation

## 2018-03-28 DIAGNOSIS — K2100 Gastro-esophageal reflux disease with esophagitis, without bleeding: Secondary | ICD-10-CM | POA: Insufficient documentation

## 2018-03-28 DIAGNOSIS — F900 Attention-deficit hyperactivity disorder, predominantly inattentive type: Secondary | ICD-10-CM | POA: Insufficient documentation

## 2018-03-28 DIAGNOSIS — E6609 Other obesity due to excess calories: Secondary | ICD-10-CM | POA: Insufficient documentation

## 2018-03-28 DIAGNOSIS — F1190 Opioid use, unspecified, uncomplicated: Secondary | ICD-10-CM | POA: Insufficient documentation

## 2018-03-28 DIAGNOSIS — F5101 Primary insomnia: Secondary | ICD-10-CM | POA: Insufficient documentation

## 2018-06-13 DIAGNOSIS — Z90710 Acquired absence of both cervix and uterus: Secondary | ICD-10-CM | POA: Insufficient documentation

## 2019-10-07 ENCOUNTER — Encounter (HOSPITAL_COMMUNITY): Payer: Self-pay | Admitting: Emergency Medicine

## 2019-10-07 ENCOUNTER — Emergency Department (HOSPITAL_COMMUNITY)
Admission: EM | Admit: 2019-10-07 | Discharge: 2019-10-08 | Disposition: A | Discharge status: Home / Self Care | Payer: Medicaid Other | Attending: Emergency Medicine | Admitting: Emergency Medicine

## 2019-10-07 ENCOUNTER — Emergency Department (HOSPITAL_COMMUNITY): Payer: Medicaid Other

## 2019-10-07 ENCOUNTER — Other Ambulatory Visit: Payer: Self-pay

## 2019-10-07 DIAGNOSIS — S59912A Unspecified injury of left forearm, initial encounter: Secondary | ICD-10-CM | POA: Diagnosis present

## 2019-10-07 DIAGNOSIS — Z79899 Other long term (current) drug therapy: Secondary | ICD-10-CM | POA: Insufficient documentation

## 2019-10-07 DIAGNOSIS — F17210 Nicotine dependence, cigarettes, uncomplicated: Secondary | ICD-10-CM | POA: Insufficient documentation

## 2019-10-07 DIAGNOSIS — E039 Hypothyroidism, unspecified: Secondary | ICD-10-CM | POA: Insufficient documentation

## 2019-10-07 DIAGNOSIS — Z20822 Contact with and (suspected) exposure to covid-19: Secondary | ICD-10-CM | POA: Insufficient documentation

## 2019-10-07 DIAGNOSIS — S5012XA Contusion of left forearm, initial encounter: Secondary | ICD-10-CM | POA: Diagnosis not present

## 2019-10-07 DIAGNOSIS — Z79890 Hormone replacement therapy (postmenopausal): Secondary | ICD-10-CM | POA: Insufficient documentation

## 2019-10-07 DIAGNOSIS — T07XXXA Unspecified multiple injuries, initial encounter: Secondary | ICD-10-CM

## 2019-10-07 LAB — CBC WITH DIFFERENTIAL/PLATELET
Abs Immature Granulocytes: 0.02 10*3/uL (ref 0.00–0.07)
Basophils Absolute: 0.1 10*3/uL (ref 0.0–0.1)
Basophils Relative: 1 %
Eosinophils Absolute: 0.3 10*3/uL (ref 0.0–0.5)
Eosinophils Relative: 3 %
HCT: 31.5 % — ABNORMAL LOW (ref 36.0–46.0)
Hemoglobin: 10.2 g/dL — ABNORMAL LOW (ref 12.0–15.0)
Immature Granulocytes: 0 %
Lymphocytes Relative: 17 %
Lymphs Abs: 1.3 10*3/uL (ref 0.7–4.0)
MCH: 32.2 pg (ref 26.0–34.0)
MCHC: 32.4 g/dL (ref 30.0–36.0)
MCV: 99.4 fL (ref 80.0–100.0)
Monocytes Absolute: 0.7 10*3/uL (ref 0.1–1.0)
Monocytes Relative: 9 %
Neutro Abs: 5.6 10*3/uL (ref 1.7–7.7)
Neutrophils Relative %: 70 %
Platelets: 269 10*3/uL (ref 150–400)
RBC: 3.17 MIL/uL — ABNORMAL LOW (ref 3.87–5.11)
RDW: 14.6 % (ref 11.5–15.5)
WBC: 7.9 10*3/uL (ref 4.0–10.5)
nRBC: 0 % (ref 0.0–0.2)

## 2019-10-07 LAB — COMPREHENSIVE METABOLIC PANEL
ALT: 29 U/L (ref 0–44)
AST: 26 U/L (ref 15–41)
Albumin: 3.6 g/dL (ref 3.5–5.0)
Alkaline Phosphatase: 74 U/L (ref 38–126)
Anion gap: 9 (ref 5–15)
BUN: 11 mg/dL (ref 6–20)
CO2: 25 mmol/L (ref 22–32)
Calcium: 8.7 mg/dL — ABNORMAL LOW (ref 8.9–10.3)
Chloride: 105 mmol/L (ref 98–111)
Creatinine, Ser: 0.63 mg/dL (ref 0.44–1.00)
GFR calc Af Amer: >60 mL/min (ref >60)
GFR calc non Af Amer: >60 mL/min (ref >60)
Glucose, Bld: 117 mg/dL — ABNORMAL HIGH (ref 70–99)
Potassium: 3.7 mmol/L (ref 3.5–5.1)
Sodium: 139 mmol/L (ref 135–145)
Total Bilirubin: 0.8 mg/dL (ref 0.3–1.2)
Total Protein: 6.6 g/dL (ref 6.5–8.1)

## 2019-10-07 LAB — RESPIRATORY PANEL BY RT PCR (FLU A&B, COVID)
Influenza A by PCR: NEGATIVE
Influenza B by PCR: NEGATIVE
SARS Coronavirus 2 by RT PCR: NEGATIVE

## 2019-10-07 LAB — D-DIMER, QUANTITATIVE: D-Dimer, Quant: 0.64 ug/mL-FEU — ABNORMAL HIGH (ref 0.00–0.50)
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
## 2019-10-07 NOTE — ED Triage Notes (Signed)
Pt arrives via [facility] w/complaints of SOB & coough since yesterday. pts daughter tested positive for covid this week.
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
## 2022-06-22 DIAGNOSIS — L03116 Cellulitis of left lower limb: Secondary | ICD-10-CM | POA: Insufficient documentation

## 2022-06-22 DIAGNOSIS — Z716 Tobacco abuse counseling: Secondary | ICD-10-CM | POA: Insufficient documentation

## 2023-05-02 ENCOUNTER — Emergency Department (HOSPITAL_COMMUNITY)
Admission: EM | Admit: 2023-05-02 | Discharge: 2023-05-02 | Disposition: A | Discharge status: Home / Self Care | Attending: Emergency Medicine | Admitting: Emergency Medicine

## 2023-05-02 ENCOUNTER — Encounter (HOSPITAL_COMMUNITY): Payer: Self-pay | Admitting: *Deleted

## 2023-05-02 ENCOUNTER — Other Ambulatory Visit: Payer: Self-pay

## 2023-05-02 ENCOUNTER — Emergency Department (HOSPITAL_COMMUNITY)

## 2023-05-02 DIAGNOSIS — S92252A Displaced fracture of navicular [scaphoid] of left foot, initial encounter for closed fracture: Secondary | ICD-10-CM | POA: Diagnosis not present

## 2023-05-02 DIAGNOSIS — Z79899 Other long term (current) drug therapy: Secondary | ICD-10-CM | POA: Diagnosis not present

## 2023-05-02 DIAGNOSIS — W182XXA Fall in (into) shower or empty bathtub, initial encounter: Secondary | ICD-10-CM | POA: Diagnosis not present

## 2023-05-02 DIAGNOSIS — F17210 Nicotine dependence, cigarettes, uncomplicated: Secondary | ICD-10-CM | POA: Insufficient documentation

## 2023-05-02 DIAGNOSIS — S93402A Sprain of unspecified ligament of left ankle, initial encounter: Secondary | ICD-10-CM | POA: Diagnosis not present

## 2023-05-02 DIAGNOSIS — E039 Hypothyroidism, unspecified: Secondary | ICD-10-CM | POA: Insufficient documentation

## 2023-05-02 DIAGNOSIS — S99922A Unspecified injury of left foot, initial encounter: Secondary | ICD-10-CM | POA: Diagnosis present

## 2023-05-02 DIAGNOSIS — S92355A Nondisplaced fracture of fifth metatarsal bone, left foot, initial encounter for closed fracture: Secondary | ICD-10-CM | POA: Insufficient documentation

## 2023-05-02 MED ORDER — OXYCODONE HCL 5 MG PO TABS
5.0000 mg | ORAL_TABLET | ORAL | 0 refills | Status: DC | PRN
Start: 2023-05-02 — End: 2023-12-01

## 2023-05-02 MED ORDER — ACETAMINOPHEN 325 MG PO TABS: 650.0000 mg | ORAL_TABLET | Freq: Four times a day (QID) | ORAL | 0 refills | Status: AC | PRN

## 2023-05-02 MED ORDER — IBUPROFEN 600 MG PO TABS
600.0000 mg | ORAL_TABLET | Freq: Four times a day (QID) | ORAL | 0 refills | Status: DC | PRN
Start: 2023-05-02 — End: 2023-12-01

## 2023-05-02 MED ORDER — HYDROCODONE-ACETAMINOPHEN 5-325 MG PO TABS
1.0000 | ORAL_TABLET | Freq: Once | ORAL | Status: AC
  Administered 2023-05-02: 1 via ORAL
  Filled 2023-05-02: qty 1

## 2023-05-02 MED ORDER — IBUPROFEN 800 MG PO TABS
800.0000 mg | ORAL_TABLET | Freq: Once | ORAL | Status: AC
  Administered 2023-05-02: 800 mg via ORAL
  Filled 2023-05-02: qty 1

## 2023-05-02 MED ORDER — OXYCODONE-ACETAMINOPHEN 5-325 MG PO TABS
2.0000 | ORAL_TABLET | Freq: Once | ORAL | Status: AC
  Administered 2023-05-02: 2 via ORAL
  Filled 2023-05-02: qty 2

## 2023-05-02 NOTE — ED Notes (Signed)
 Slipped and fell in shower on left foot, c/o throbbing pain 10/10.
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
## 2023-05-02 NOTE — Discharge Instructions (Addendum)
 Please call orthopedics DR HARRISON to arrange follow up  In the meantime please use crutches daily, no weight bearing to your left leg  It was a pleasure caring for you today in the emergency department.  Please return to the emergency department for any worsening or worrisome symptoms.

## 2023-05-16 ENCOUNTER — Ambulatory Visit: Admitting: Orthopedic Surgery

## 2023-06-12 LAB — COLOGUARD

## 2023-11-30 ENCOUNTER — Encounter (HOSPITAL_COMMUNITY): Payer: Self-pay

## 2023-11-30 ENCOUNTER — Emergency Department (HOSPITAL_COMMUNITY)
Admission: EM | Admit: 2023-11-30 | Discharge: 2023-12-01 | Disposition: A | Discharge status: Other Acute Inpatient Hospital | Source: Home / Self Care | Attending: Emergency Medicine | Admitting: Emergency Medicine

## 2023-11-30 DIAGNOSIS — T426X2A Poisoning by other antiepileptic and sedative-hypnotic drugs, intentional self-harm, initial encounter: Secondary | ICD-10-CM | POA: Insufficient documentation

## 2023-11-30 DIAGNOSIS — R799 Abnormal finding of blood chemistry, unspecified: Secondary | ICD-10-CM | POA: Insufficient documentation

## 2023-11-30 DIAGNOSIS — X838XXA Intentional self-harm by other specified means, initial encounter: Secondary | ICD-10-CM | POA: Insufficient documentation

## 2023-11-30 DIAGNOSIS — Y907 Blood alcohol level of 200-239 mg/100 ml: Secondary | ICD-10-CM | POA: Insufficient documentation

## 2023-11-30 DIAGNOSIS — F332 Major depressive disorder, recurrent severe without psychotic features: Secondary | ICD-10-CM | POA: Insufficient documentation

## 2023-11-30 DIAGNOSIS — T50902A Poisoning by unspecified drugs, medicaments and biological substances, intentional self-harm, initial encounter: Secondary | ICD-10-CM

## 2023-11-30 DIAGNOSIS — F17200 Nicotine dependence, unspecified, uncomplicated: Secondary | ICD-10-CM | POA: Insufficient documentation

## 2023-11-30 LAB — CBC
HCT: 39.6 % (ref 36.0–46.0)
Hemoglobin: 12.8 g/dL (ref 12.0–15.0)
MCH: 28.8 pg (ref 26.0–34.0)
MCHC: 32.3 g/dL (ref 30.0–36.0)
MCV: 89 fL (ref 80.0–100.0)
Platelets: 436 K/uL — ABNORMAL HIGH (ref 150–400)
RBC: 4.45 MIL/uL (ref 3.87–5.11)
RDW: 16.8 % — ABNORMAL HIGH (ref 11.5–15.5)
WBC: 9.9 K/uL (ref 4.0–10.5)
nRBC: 0 % (ref 0.0–0.2)

## 2023-11-30 LAB — COMPREHENSIVE METABOLIC PANEL WITH GFR
ALT: 32 U/L (ref 0–44)
AST: 39 U/L (ref 15–41)
Albumin: 4.3 g/dL (ref 3.5–5.0)
Alkaline Phosphatase: 93 U/L (ref 38–126)
Anion gap: 12 (ref 5–15)
BUN: 12 mg/dL (ref 6–20)
CO2: 27 mmol/L (ref 22–32)
Calcium: 9.3 mg/dL (ref 8.9–10.3)
Chloride: 103 mmol/L (ref 98–111)
Creatinine, Ser: 0.63 mg/dL (ref 0.44–1.00)
GFR, Estimated: >60 mL/min (ref >60)
Glucose, Bld: 99 mg/dL (ref 70–99)
Potassium: 4.6 mmol/L (ref 3.5–5.1)
Sodium: 142 mmol/L (ref 135–145)
Total Bilirubin: 0.3 mg/dL (ref 0.0–1.2)
Total Protein: 7.3 g/dL (ref 6.5–8.1)

## 2023-11-30 LAB — SALICYLATE LEVEL: Salicylate Lvl: <7 mg/dL — ABNORMAL LOW (ref 7.0–30.0)

## 2023-11-30 LAB — CBG MONITORING, ED
Glucose-Capillary: 103 mg/dL — ABNORMAL HIGH (ref 70–99)
Glucose-Capillary: 99 mg/dL (ref 70–99)

## 2023-11-30 LAB — ACETAMINOPHEN LEVEL: Acetaminophen (Tylenol), Serum: <10 ug/mL — ABNORMAL LOW (ref 10–30)

## 2023-11-30 LAB — ETHANOL: Alcohol, Ethyl (B): 237 mg/dL — ABNORMAL HIGH (ref <15)

## 2023-11-30 MED ORDER — ZIPRASIDONE MESYLATE 20 MG IM SOLR
INTRAMUSCULAR | Status: AC
  Administered 2023-11-30: 10 mg via INTRAMUSCULAR
  Filled 2023-11-30: qty 20

## 2023-11-30 MED ORDER — ZIPRASIDONE MESYLATE 20 MG IM SOLR: 10.0000 mg | Freq: Once | INTRAMUSCULAR | Status: AC

## 2023-11-30 MED ORDER — ACETAMINOPHEN 325 MG PO TABS
650.0000 mg | ORAL_TABLET | Freq: Once | ORAL | Status: AC
  Administered 2023-11-30: 650 mg via ORAL
  Filled 2023-11-30: qty 2

## 2023-11-30 NOTE — ED Notes (Signed)
 Around 2000, pt started to rip off monitoring cords and tried to get out of the bed by scooting down to the end of the bed. Pt yelled I want to leave. I do not want to sit here anymore. I can do this at home. Meanwhile, pt has been trying to get in touch with family who is not answering their phones. Pt was asked to stay in the room while this nurse finds the provider. This nurse was able to find the provider and pt was still in the room. Pt started to be more aggressive and boisterous towards staff. Provider notified pt she cannot leave because it is not safe to do so since you took alcohol with gabapentin . Since the pt kept saying I am leaving one way or another, the provider filled out IVC paperwork. Provider also asked this nurse to give 10 mg Geodon  IM. Kayla RN pulled the medications and gave it to this nurse to administer. Pt was made aware she is going to giving Geodon  IM to calm her down. Pt refuses and states you are not putting a chemical in me until my son gets here. Pt was made aware that she no longer has the choice since she is IVC. Security was called to assist staff in the IM administration. Pt was held down by staff so this nurse could admin Geodon  IM in the left anterior thigh. After shot was given, pt immediately got up the pace the room still shouting. Pt was placed in restraints to prevent self harm and the care required can continue such as VS monitoring, IV maintenance, etc. ED tech stated at bedside for monitoring.

## 2023-11-30 NOTE — ED Notes (Signed)
 Poison Control was called at 1919 for recommendations. PC recommends to obtain EKG, collect acetaminophen  level 4 hrs after pt took the gabapentin  and pt needs to be observed for a minimum of 6 hrs.

## 2023-11-30 NOTE — ED Notes (Signed)
 IVC papers were served at this time. IVC paperwork signed at 2218

## 2023-11-30 NOTE — BH Assessment (Signed)
 Per Chrisian Prosperi, PA-C note: Patient will be clinically stable for TTS eval at 0000.   Clinician messaged Tinnie Creighton, RN: I just seen the EDP note the pt will not be medically cleared until 0000. Can you removed the consult until the pt is medically cleared?   Clinician awaiting response.    Jackson JONETTA Broach, MS, [facility], [facility] Triage Specialist [phone]

## 2023-11-30 NOTE — ED Notes (Signed)
 Pt put on 2 L of O2 d/t SPO2 dropping to 87%.

## 2023-11-30 NOTE — ED Triage Notes (Signed)
 Pt comes in by EMS for an overdose on gabapentin . Pt first told EMS it was 10 300mg  of gabapentin  but later said it was only 2 pills. Pt does have etoh on board. Pt drank 1/2 of 5th of etoh. Pt denies its an overdose but the son said it was an attempt. Pt was 1st found laying on the floor mumbling. Once she was turned over, she said she took 10 pills of gabapentin  and is not going anywhere. Son and [employer] said she does not have a choice and needs to be seen. Pt is slurring words in triage but is A&Ox4.

## 2023-11-30 NOTE — BH Assessment (Signed)
 Clinician messaged Tinnie Creighton, RN: Walterine. It's Trey with TTS. Is the pt able to engage in the assessment, if so the pt will need to be placed in a private room. Is the pt under IVC? Also is the pt medically cleared?   Clinician awaiting response.    Jackson JONETTA Broach, MS, [facility], [facility] Triage Specialist [phone]
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
## 2023-11-30 NOTE — ED Notes (Signed)
 Pt dressed out and changed into burgundy scrubs.

## 2023-11-30 NOTE — ED Notes (Signed)
 Pt taken out of non-violent four point restraints at this time as pt behavior has improved

## 2023-12-01 ENCOUNTER — Other Ambulatory Visit: Payer: Self-pay

## 2023-12-01 ENCOUNTER — Inpatient Hospital Stay (HOSPITAL_COMMUNITY)
Admission: AD | Admit: 2023-12-01 | Discharge: 2023-12-03 | DRG: 885 | Disposition: A | Discharge status: Home / Self Care | Source: Intra-hospital

## 2023-12-01 ENCOUNTER — Encounter (HOSPITAL_COMMUNITY): Payer: Self-pay | Admitting: Psychiatry

## 2023-12-01 DIAGNOSIS — Z79899 Other long term (current) drug therapy: Secondary | ICD-10-CM | POA: Diagnosis not present

## 2023-12-01 DIAGNOSIS — Z560 Unemployment, unspecified: Secondary | ICD-10-CM

## 2023-12-01 DIAGNOSIS — F10129 Alcohol abuse with intoxication, unspecified: Secondary | ICD-10-CM | POA: Diagnosis present

## 2023-12-01 DIAGNOSIS — E039 Hypothyroidism, unspecified: Secondary | ICD-10-CM | POA: Diagnosis present

## 2023-12-01 DIAGNOSIS — Z818 Family history of other mental and behavioral disorders: Secondary | ICD-10-CM

## 2023-12-01 DIAGNOSIS — F331 Major depressive disorder, recurrent, moderate: Principal | ICD-10-CM | POA: Diagnosis present

## 2023-12-01 DIAGNOSIS — F329 Major depressive disorder, single episode, unspecified: Principal | ICD-10-CM | POA: Diagnosis present

## 2023-12-01 DIAGNOSIS — K219 Gastro-esophageal reflux disease without esophagitis: Secondary | ICD-10-CM | POA: Diagnosis present

## 2023-12-01 DIAGNOSIS — Z90710 Acquired absence of both cervix and uterus: Secondary | ICD-10-CM

## 2023-12-01 DIAGNOSIS — Z79890 Hormone replacement therapy (postmenopausal): Secondary | ICD-10-CM | POA: Diagnosis not present

## 2023-12-01 DIAGNOSIS — F17210 Nicotine dependence, cigarettes, uncomplicated: Secondary | ICD-10-CM | POA: Diagnosis present

## 2023-12-01 DIAGNOSIS — Z885 Allergy status to narcotic agent status: Secondary | ICD-10-CM

## 2023-12-01 DIAGNOSIS — Y908 Blood alcohol level of 240 mg/100 ml or more: Secondary | ICD-10-CM | POA: Diagnosis present

## 2023-12-01 DIAGNOSIS — Z8249 Family history of ischemic heart disease and other diseases of the circulatory system: Secondary | ICD-10-CM

## 2023-12-01 DIAGNOSIS — Z888 Allergy status to other drugs, medicaments and biological substances status: Secondary | ICD-10-CM

## 2023-12-01 LAB — URINE DRUG SCREEN
Amphetamines: NEGATIVE
Barbiturates: NEGATIVE
Benzodiazepines: NEGATIVE
Cocaine: NEGATIVE
Fentanyl: NEGATIVE
Methadone Scn, Ur: NEGATIVE
Opiates: NEGATIVE
Tetrahydrocannabinol: POSITIVE — AB

## 2023-12-01 LAB — ACETAMINOPHEN LEVEL: Acetaminophen (Tylenol), Serum: <10 ug/mL — ABNORMAL LOW (ref 10–30)

## 2023-12-01 LAB — MAGNESIUM: Magnesium: 2.2 mg/dL (ref 1.7–2.4)

## 2023-12-01 MED ORDER — HALOPERIDOL LACTATE 5 MG/ML IJ SOLN: 10.0000 mg | Freq: Three times a day (TID) | INTRAMUSCULAR | Status: DC | PRN

## 2023-12-01 MED ORDER — DIPHENHYDRAMINE HCL 50 MG/ML IJ SOLN: 50.0000 mg | Freq: Three times a day (TID) | INTRAMUSCULAR | Status: DC | PRN

## 2023-12-01 MED ORDER — ACETAMINOPHEN 500 MG PO TABS
1000.0000 mg | ORAL_TABLET | Freq: Once | ORAL | Status: AC
  Administered 2023-12-01: 1000 mg via ORAL
  Filled 2023-12-01: qty 2

## 2023-12-01 MED ORDER — HALOPERIDOL 5 MG PO TABS: 5.0000 mg | ORAL_TABLET | Freq: Three times a day (TID) | ORAL | Status: DC | PRN

## 2023-12-01 MED ORDER — ACETAMINOPHEN 325 MG PO TABS
650.0000 mg | ORAL_TABLET | Freq: Four times a day (QID) | ORAL | Status: DC | PRN
  Administered 2023-12-01 – 2023-12-02 (×3): 650 mg via ORAL
  Filled 2023-12-01 (×3): qty 2

## 2023-12-01 MED ORDER — HALOPERIDOL LACTATE 5 MG/ML IJ SOLN: 5.0000 mg | Freq: Three times a day (TID) | INTRAMUSCULAR | Status: DC | PRN

## 2023-12-01 MED ORDER — LORAZEPAM 2 MG/ML IJ SOLN: 2.0000 mg | Freq: Three times a day (TID) | INTRAMUSCULAR | Status: DC | PRN

## 2023-12-01 MED ORDER — TRAZODONE HCL 50 MG PO TABS
50.0000 mg | ORAL_TABLET | Freq: Every evening | ORAL | Status: DC | PRN
  Administered 2023-12-01 – 2023-12-02 (×2): 50 mg via ORAL
  Filled 2023-12-01 (×2): qty 1

## 2023-12-01 MED ORDER — ALUM & MAG HYDROXIDE-SIMETH 200-200-20 MG/5ML PO SUSP: 30.0000 mL | ORAL | Status: DC | PRN

## 2023-12-01 MED ORDER — HYDROXYZINE HCL 25 MG PO TABS
25.0000 mg | ORAL_TABLET | Freq: Three times a day (TID) | ORAL | Status: DC | PRN
  Administered 2023-12-01 – 2023-12-02 (×3): 25 mg via ORAL
  Filled 2023-12-01 (×3): qty 1

## 2023-12-01 MED ORDER — NICOTINE 14 MG/24HR TD PT24
14.0000 mg | MEDICATED_PATCH | Freq: Every day | TRANSDERMAL | Status: DC
  Administered 2023-12-01 – 2023-12-03 (×3): 14 mg via TRANSDERMAL
  Filled 2023-12-01 (×3): qty 1

## 2023-12-01 MED ORDER — DIPHENHYDRAMINE HCL 25 MG PO CAPS: 50.0000 mg | ORAL_CAPSULE | Freq: Three times a day (TID) | ORAL | Status: DC | PRN

## 2023-12-01 MED ORDER — LEVOTHYROXINE SODIUM 125 MCG PO TABS
125.0000 ug | ORAL_TABLET | Freq: Every day | ORAL | Status: DC
  Administered 2023-12-02 – 2023-12-03 (×2): 125 ug via ORAL
  Filled 2023-12-01 (×2): qty 1

## 2023-12-01 MED ORDER — MAGNESIUM HYDROXIDE 400 MG/5ML PO SUSP: 30.0000 mL | Freq: Every day | ORAL | Status: DC | PRN
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
## 2023-12-01 NOTE — ED Notes (Signed)
 NP Starlyn Patron is recommending inpatient psychiatric care for patient.

## 2023-12-01 NOTE — ED Notes (Signed)
 Pts belongings (shirt, pants, and socks) placed in locker 1

## 2023-12-01 NOTE — Progress Notes (Signed)
(  Sleep Hours) - (Any PRNs that were needed, meds refused, or side effects to meds)- trazodone  (Any disturbances and when (visitation, over night)- none (Concerns raised by the patient)- none (SI/HI/AVH)- denies all

## 2023-12-01 NOTE — ED Notes (Signed)
Pt made first phone call  

## 2023-12-01 NOTE — ED Notes (Signed)
Pt being TTS at this time  

## 2023-12-01 NOTE — Group Note (Signed)
 Date:  12/01/2023 Time:  8:46 PM  Group Topic/Focus:  Addiction - Ted Talk on Addiction/[place]    Participation Level:  Did Not Attend  [name] 12/01/2023, 8:46 PM

## 2023-12-01 NOTE — ED Notes (Signed)
TTS cart placed in pt.s room 

## 2023-12-01 NOTE — Progress Notes (Signed)
 Admission Note: Patient is a 53 years old female admitted to the unit under IVC from [facility] due to alcohol abuse, depression and anxiety.  BAL prior to admission was 237.  Patient denies suicidal thoughts, auditory and visual hallucinations.  Patient is alert and oriented x 4.  Patient presents with anxious affect and mood.  Admission plan of care reviewed, consent for treatment signed.  Skin and personal belonging search completed.  Skin is dry and intact.  Items deemed contraband placed in the locker.  Patient oriented to the unit, staff and room.  Routine safety checks initiated.  Verbalizes understanding of unit rules/protocols.  Patient is safe on the unit at this time.
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
## 2023-12-01 NOTE — BHH Group Notes (Signed)
 Adult Psychoeducational Group Note  Date:  12/01/2023 Time:  8:30 PM  Group Topic/Focus:  Wrap-Up Group:   The focus of this group is to help patients review their daily goal of treatment and discuss progress on daily workbooks.  Participation Level:  Active  Participation Quality:  Appropriate  Affect:  Appropriate  Cognitive:  Appropriate  Insight: Appropriate  Engagement in Group:  Engaged  Modes of Intervention:  Education  Additional Comments:     Lang Donia Law 12/01/2023, 8:30 PM

## 2023-12-01 NOTE — Plan of Care (Signed)
   Problem: Education: Goal: Emotional status will improve Outcome: Progressing

## 2023-12-01 NOTE — Tx Team (Signed)
 Initial Treatment Plan 12/01/2023 2:25 PM [name] FMW:[id number]    PATIENT STRESSORS: Health problems   Marital or family conflict   Substance abuse     PATIENT STRENGTHS: Ability for insight  Communication skills  Motivation for treatment/growth  Supportive family/friends    PATIENT IDENTIFIED PROBLEMS: To get my medications  Alcohol Abuse  Anxiety  Depression  Ineffective coping skills             DISCHARGE CRITERIA:  Ability to meet basic life and health needs Motivation to continue treatment in a less acute level of care  PRELIMINARY DISCHARGE PLAN: Attend aftercare/continuing care group Outpatient therapy Return to previous living arrangement  PATIENT/FAMILY INVOLVEMENT: This treatment plan has been presented to and reviewed with the patient, [name], and/or family member.  The patient and family have been given the opportunity to ask questions and make suggestions.  Almarie MALVA Lowers, RN 12/01/2023, 2:25 PM

## 2023-12-01 NOTE — Plan of Care (Signed)
 Pt was out in the milieu during the day acting appropriately. Went to [employer] and ate adequately. Attending group activity and participated. Denies SI/HI/SH/paranoia/AVH. Will continue to monitor.

## 2023-12-01 NOTE — Progress Notes (Signed)
 BHH/BMU LCSW Progress Note   12/01/2023    9:57 AM  [name]   [id number]   Type of Contact and Topic:  Psychiatric Bed Placement   Pt accepted to [facility] 402-2    Patient meets inpatient criteria per Starlyn Patron, NP    The attending provider will be Dr. Raliegh  Call report to [phone]    Dale Bound, RN @ [facility] notified.     Pt scheduled  to arrive at [facility] TODAY.    Bunnie Gallop, MSW, LCSW-A  9:59 AM 12/01/2023

## 2023-12-01 NOTE — ED Notes (Signed)
 IV removed per pt request prior to shower.

## 2023-12-01 NOTE — Progress Notes (Signed)
   12/01/23 1958  Psych Admission Type (Psych Patients Only)  Admission Status Involuntary  Psychosocial Assessment  Patient Complaints Anxiety  Eye Contact Fair  Facial Expression Anxious  Affect Appropriate to circumstance;Anxious  Speech Logical/coherent  Interaction Minimal  Motor Activity Fidgety  Appearance/Hygiene In scrubs  Behavior Characteristics Appropriate to situation  Mood Anxious;Pleasant  Thought Process  Coherency WDL  Content WDL  Delusions None reported or observed  Perception WDL  Hallucination None reported or observed  Judgment Poor  Confusion None  Danger to Self  Current suicidal ideation? Denies

## 2023-12-01 NOTE — ED Provider Notes (Signed)
 Emergency Medicine Observation Re-evaluation Note  [name] is a 53 y.o. female, seen on rounds today.  Pt initially presented to the ED for complaints of Drug Overdose Currently, the patient is resting comfortably.  Physical Exam  BP 113/74   Pulse 64   Temp 97.8 F (36.6 C) (Oral)   Resp (!) 22   Ht 1.486 m (4' 10.5)   Wt 78.9 kg   SpO2 98%   BMI 35.74 kg/m  Physical Exam General: No distress Cardiac: Normal heart rate Lungs: No increased work of breathing, oxygen 98% Psych: Blunted affect  ED Course / MDM  EKG:EKG Interpretation Date/Time:  Saturday November 30 2023 18:44:45 EST Ventricular Rate:  71 PR Interval:  124 QRS Duration:  130 QT Interval:  437 QTC Calculation: 475 R Axis:   1  Text Interpretation: Sinus rhythm Nonspecific intraventricular conduction delay Interpretation limited secondary to artifact similar to prior no sig change Confirmed by Elnor Savant ([id number]) on 11/30/2023 9:33:44 PM  I have reviewed the labs performed to date as well as medications administered while in observation.  Recent changes in the last 24 hours include no changes, improving after initial arrival.  Plan  Current plan is for psychiatric placement, under IVC.    Brooke Rogue, MD 12/01/23 [phone]

## 2023-12-02 ENCOUNTER — Encounter (HOSPITAL_COMMUNITY): Payer: Self-pay

## 2023-12-02 MED ORDER — FLUOXETINE HCL 20 MG PO CAPS
60.0000 mg | ORAL_CAPSULE | Freq: Every day | ORAL | Status: DC
  Administered 2023-12-02 – 2023-12-03 (×2): 60 mg via ORAL
  Filled 2023-12-02 (×2): qty 3

## 2023-12-02 MED ORDER — ALBUTEROL SULFATE HFA 108 (90 BASE) MCG/ACT IN AERS
2.0000 | INHALATION_SPRAY | RESPIRATORY_TRACT | Status: DC | PRN
Start: 2023-12-02 — End: 2023-12-03

## 2023-12-02 MED ORDER — PANTOPRAZOLE SODIUM 40 MG PO TBEC
40.0000 mg | DELAYED_RELEASE_TABLET | Freq: Every day | ORAL | Status: DC
  Administered 2023-12-02 – 2023-12-03 (×2): 40 mg via ORAL
  Filled 2023-12-02 (×2): qty 1

## 2023-12-02 MED ORDER — ROSUVASTATIN CALCIUM 5 MG PO TABS
10.0000 mg | ORAL_TABLET | Freq: Every day | ORAL | Status: DC
  Administered 2023-12-02 – 2023-12-03 (×2): 10 mg via ORAL
  Filled 2023-12-02: qty 2

## 2023-12-02 NOTE — Plan of Care (Signed)
   Problem: Education: Goal: Knowledge of [name] General Education information/materials will improve Outcome: Progressing Goal: Emotional status will improve Outcome: Progressing Goal: Mental status will improve Outcome: Progressing Goal: Verbalization of understanding the information provided will improve Outcome: Progressing   Problem: Activity: Goal: Interest or engagement in activities will improve Outcome: Progressing Goal: Sleeping patterns will improve Outcome: Progressing   Problem: Coping: Goal: Ability to verbalize frustrations and anger appropriately will improve Outcome: Progressing Goal: Ability to demonstrate self-control will improve Outcome: Progressing

## 2023-12-02 NOTE — Group Note (Signed)
 Date:  12/02/2023 Time:  12:54 PM  Group Topic/Focus:  Physical Wellness:   The focus of this group is to encourage patients  to improve and maintain their physical health through regular movement, exercise, and wellness practices. Today, the patients followed an exercise video.    Participation Level:  Active  Participation Quality:  Appropriate  Affect:  Appropriate  Cognitive:  Appropriate  Insight: Appropriate  Engagement in Group:  Engaged  Modes of Intervention:  Activity  Additional Comments:    Jasiel Belisle D Natalye Kott 12/02/2023, 12:54 PM

## 2023-12-02 NOTE — Group Note (Signed)
 Date:  12/02/2023 Time:  4:00 PM  Group Topic/Focus:  Chaplain Group (Grief and Loss)  Pt did attend grief and loss group by chaplain  [name] 12/02/2023, 4:00 PM

## 2023-12-02 NOTE — Group Note (Signed)
 Date:  12/02/2023 Time:  10:06 AM  Group Topic/Focus:  Goals Group:   The focus of this group is to help patients establish daily goals to achieve during treatment and discuss how the patient can incorporate goal setting into their daily lives to aide in recovery.    Participation Level:  Did Not Attend  Participation Quality:  Did Not Attend  Affect:  Did Not Attend  Cognitive:  Did Not Attend  Insight: None  Engagement in Group:  Did Not Attend  Modes of Intervention:  Did Not Attend  Additional Comments:  Did Not Attend  [name] 12/02/2023, 10:06 AM

## 2023-12-02 NOTE — Group Note (Signed)
 Date:  12/02/2023 Time:  6:58 PM  Group Topic/Focus:  Occupational Therapy    Participation Level:  Did Not Attend  [name] 12/02/2023, 6:58 PM

## 2023-12-02 NOTE — Progress Notes (Signed)
 [name]  Type of Note: Discharge Info  Son, Shiah Berhow agreed to pick up his mom, the pt at 10:30 am at [facility].  He did not have any questions about discharge, aftercare, etc.  Provided necessary psychoeducation re community resources, crisis intervention, future suicidal ideation, etc.  Appreciative of the info.  Signed: Syleena Mchan, LCSW 12/02/2023 4:05 PM

## 2023-12-02 NOTE — BH IP Treatment Plan (Signed)
 Interdisciplinary Treatment and Diagnostic Plan Update  12/02/2023 Time of Session: 10:50 AM [name] MRN: [id number]  Principal Diagnosis: MDD (major depressive disorder)  Secondary Diagnoses: Principal Problem:   MDD (major depressive disorder)   Current Medications:  Current Facility-Administered Medications  Medication Dose Route Frequency Provider Last Rate Last Admin   acetaminophen  (TYLENOL ) tablet 650 mg  650 mg Oral Q6H PRN Motley-Mangrum, Jadeka A, PMHNP   650 mg at 12/02/23 1048   alum & mag hydroxide-simeth (MAALOX/MYLANTA) 200-200-20 MG/5ML suspension 30 mL  30 mL Oral Q4H PRN Motley-Mangrum, Jadeka A, PMHNP       haloperidol  (HALDOL ) tablet 5 mg  5 mg Oral TID PRN Motley-Mangrum, Jadeka A, PMHNP       And   diphenhydrAMINE  (BENADRYL ) capsule 50 mg  50 mg Oral TID PRN Motley-Mangrum, Jadeka A, PMHNP       haloperidol  lactate (HALDOL ) injection 5 mg  5 mg Intramuscular TID PRN Motley-Mangrum, Jadeka A, PMHNP       And   diphenhydrAMINE  (BENADRYL ) injection 50 mg  50 mg Intramuscular TID PRN Motley-Mangrum, Jadeka A, PMHNP       And   LORazepam  (ATIVAN ) injection 2 mg  2 mg Intramuscular TID PRN Motley-Mangrum, Jadeka A, PMHNP       haloperidol  lactate (HALDOL ) injection 10 mg  10 mg Intramuscular TID PRN Motley-Mangrum, Jadeka A, PMHNP       And   diphenhydrAMINE  (BENADRYL ) injection 50 mg  50 mg Intramuscular TID PRN Motley-Mangrum, Jadeka A, PMHNP       And   LORazepam  (ATIVAN ) injection 2 mg  2 mg Intramuscular TID PRN Motley-Mangrum, Jadeka A, PMHNP       hydrOXYzine  (ATARAX ) tablet 25 mg  25 mg Oral TID PRN Motley-Mangrum, Jadeka A, PMHNP   25 mg at 12/02/23 1445   levothyroxine  (SYNTHROID ) tablet 125 mcg  125 mcg Oral QAC breakfast Motley-Mangrum, Jadeka A, PMHNP   125 mcg at 12/02/23 9361   magnesium  hydroxide (MILK OF MAGNESIA) suspension 30 mL  30 mL Oral Daily PRN Motley-Mangrum, Jadeka A, PMHNP       nicotine  (NICODERM CQ  - dosed in mg/24 hours) patch 14  mg  14 mg Transdermal Daily Pashayan, Alexander S, DO   14 mg at 12/02/23 1048   pantoprazole  (PROTONIX ) EC tablet 40 mg  40 mg Oral Daily Prentis Kitchens A, DO   40 mg at 12/02/23 1445   traZODone  (DESYREL ) tablet 50 mg  50 mg Oral QHS PRN Bobbitt, Shalon E, NP   50 mg at 12/01/23 2131   PTA Medications: Medications Prior to Admission  Medication Sig Dispense Refill Last Dose/Taking   acetaminophen  (TYLENOL ) 325 MG tablet Take 2 tablets (650 mg total) by mouth every 6 (six) hours as needed. 36 tablet 0 12/01/2023 Morning   albuterol  (VENTOLIN  HFA) 108 (90 Base) MCG/ACT inhaler Inhale 2 puffs into the lungs every 4 (four) hours as needed for wheezing or shortness of breath. H/O smoking   11/30/2023 Noon   eszopiclone (LUNESTA) 1 MG TABS tablet Take 1 mg by mouth at bedtime.   Past Week   FLUoxetine  HCl 60 MG TABS Take 60 mg by mouth daily.   11/30/2023 Morning   gabapentin  (NEURONTIN ) 300 MG capsule Take 300 mg by mouth 3 (three) times daily. Pt wants to stop taking because it causes swelling and gains weight, when she stopped taking it for a two week period of time she dropped weight drastically and swelling decreased   Past  Week   levothyroxine  (SYNTHROID , LEVOTHROID) 125 MCG tablet Take 125 mcg by mouth daily before breakfast.   11/30/2023 Morning   omeprazole (PRILOSEC) 20 MG capsule Take 20 mg by mouth daily.   11/30/2023 Morning   rosuvastatin  (CRESTOR ) 10 MG tablet Take 10 mg by mouth at bedtime.   Past Week    Patient Stressors: Health problems   Marital or family conflict   Substance abuse    Patient Strengths: Ability for insight  Printmaker for treatment/growth  Supportive family/friends   Treatment Modalities: Medication Management, Group therapy, Case management,  1 to 1 session with clinician, Psychoeducation, Recreational therapy.   Physician Treatment Plan for Primary Diagnosis: MDD (major depressive disorder) Long Term Goal(s):     Short Term  Goals:    Medication Management: Evaluate patient's response, side effects, and tolerance of medication regimen.  Therapeutic Interventions: 1 to 1 sessions, Unit Group sessions and Medication administration.  Evaluation of Outcomes: Adequate for Discharge  Physician Treatment Plan for Secondary Diagnosis: Principal Problem:   MDD (major depressive disorder)  Long Term Goal(s):     Short Term Goals:       Medication Management: Evaluate patient's response, side effects, and tolerance of medication regimen.  Therapeutic Interventions: 1 to 1 sessions, Unit Group sessions and Medication administration.  Evaluation of Outcomes: Adequate for Discharge   RN Treatment Plan for Primary Diagnosis: MDD (major depressive disorder) Long Term Goal(s): Knowledge of disease and therapeutic regimen to maintain health will improve  Short Term Goals: Ability to remain free from injury will improve, Ability to verbalize frustration and anger appropriately will improve, Ability to demonstrate self-control, Ability to participate in decision making will improve, Ability to verbalize feelings will improve, Ability to disclose and discuss suicidal ideas, Ability to identify and develop effective coping behaviors will improve, and Compliance with prescribed medications will improve  Medication Management: RN will administer medications as ordered by provider, will assess and evaluate patient's response and provide education to patient for prescribed medication. RN will report any adverse and/or side effects to prescribing provider.  Therapeutic Interventions: 1 on 1 counseling sessions, Psychoeducation, Medication administration, Evaluate responses to treatment, Monitor vital signs and CBGs as ordered, Perform/monitor CIWA, COWS, AIMS and Fall Risk screenings as ordered, Perform wound care treatments as ordered.  Evaluation of Outcomes: Adequate for Discharge   LCSW Treatment Plan for Primary Diagnosis:  MDD (major depressive disorder) Long Term Goal(s): Safe transition to appropriate next level of care at discharge, Engage patient in therapeutic group addressing interpersonal concerns.  Short Term Goals: Engage patient in aftercare planning with referrals and resources, Increase social support, Increase ability to appropriately verbalize feelings, Increase emotional regulation, Facilitate acceptance of mental health diagnosis and concerns, Facilitate patient progression through stages of change regarding substance use diagnoses and concerns, Identify triggers associated with mental health/substance abuse issues, and Increase skills for wellness and recovery  Therapeutic Interventions: Assess for all discharge needs, 1 to 1 time with Social worker, Explore available resources and support systems, Assess for adequacy in community support network, Educate family and significant other(s) on suicide prevention, Complete Psychosocial Assessment, Interpersonal group therapy.  Evaluation of Outcomes: Adequate for Discharge   Progress in Treatment: Attending groups: No. Participating in groups: No. Taking medication as prescribed: Yes. Toleration medication: Yes. Family/Significant other contact made: No, will contact:  Emilea Goga (son) [phone] Patient understands diagnosis: Yes. Discussing patient identified problems/goals with staff: Yes. Medical problems stabilized or resolved: Yes. Denies suicidal/homicidal ideation: Yes.  Issues/concerns per patient self-inventory: No. None reported.  New problem(s) identified: No, Describe:  None identified.  New Short Term/Long Term Goal(s): medication stabilization, elimination of SI thoughts, development of comprehensive mental wellness plan.    Patient Goals:  Getting along with my children better and outpatient therapy.  Discharge Plan or Barriers: Patient recently admitted. CSW will continue to follow and assess for appropriate referrals and  possible discharge planning.    Reason for Continuation of Hospitalization: Medication stabilization  Estimated Length of Stay: 1-3 days.  Last 3 [place] Suicide Severity Risk Score: Flowsheet Row Ad[facility] (Current) [facility] from 11/30/2023 in [facility] at [facility] ED from 05/02/2023 in [facility] Emergency Department at [facility]  C-SSRS RISK CATEGORY No Risk No Risk No Risk    Last [facility] 2/9 Scores:     No data to display          Scribe for Treatment Team: Katrece Roediger  Nunez-Uva, LCSWA 12/02/2023 3:40 PM
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
## 2023-12-02 NOTE — [facility] Suicide Risk Assessment (Signed)
 [facility] INPATIENT:  Family/Significant Other Suicide Prevention Education  Suicide Prevention Education:  Education Completed; Marylee Redo,  (name of family member/son) has been identified by the patient as the family member/significant other with whom the patient will be residing, and identified as the person(s) who will aid the patient in the event of a mental health crisis (suicidal ideations/suicide attempt).  With written consent from the patient, the family member/significant other has been provided the following suicide prevention education, prior to the and/or following the discharge of the patient.  Marylee denied having or storing weapons in the home, felt his mother is not a danger to herself or anyone else.  He felt confident in being able to keep her safe to the extent possible.  He appreciated the community resources in case there is another crisis in the future or a mental health need.  The suicide prevention education provided includes the following: Suicide risk factors Suicide prevention and interventions National Suicide Hotline telephone number [facility] assessment telephone number [facility] Emergency Assistance 911 [facility] and/or Residential Mobile Crisis Unit telephone number  Request made of family/significant other to: Remove weapons (e.g., guns, rifles, knives), all items previously/currently identified as safety concern.   Remove drugs/medications (over-the-counter, prescriptions, illicit drugs), all items previously/currently identified as a safety concern.  The family member/significant other verbalizes understanding of the suicide prevention education information provided.  The family member/significant other agrees to remove the items of safety concern listed above.  Derick JONELLE Blanch 12/02/2023, 4:02 PM and Patient Discharged to Other Healthcare Facility:  Suicide Prevention Education Not Provided: {PT. DISCHARGED TO OTHER HEALTHCARE FACILITY:SUICIDE  PREVENTION EDUCATION NOT PROVIDED (CHL):  The patient is discharging to another healthcare facility for continuation of treatment.  The patient's medical information, including suicide ideations and risk factors, are a part of the medical information shared with the receiving healthcare facility.  Derick JONELLE Blanch 12/02/2023, 4:02 PM

## 2023-12-02 NOTE — Plan of Care (Signed)
   Problem: Education: Goal: Knowledge of [facility] Education information/materials will improve Outcome: Progressing   Problem: Activity: Goal: Interest or engagement in activities will improve Outcome: Progressing   Problem: Coping: Goal: Ability to verbalize frustrations and anger appropriately will improve Outcome: Progressing   Problem: Safety: Goal: Periods of time without injury will increase Outcome: Progressing

## 2023-12-02 NOTE — Progress Notes (Signed)
   12/02/23 1038  Psych Admission Type (Psych Patients Only)  Admission Status Involuntary  Psychosocial Assessment  Patient Complaints Anxiety  Eye Contact Fair  Facial Expression Anxious  Affect Anxious  Speech Logical/coherent  Interaction Minimal  Motor Activity Fidgety  Appearance/Hygiene In scrubs  Behavior Characteristics Appropriate to situation  Mood Anxious;Pleasant  Thought Process  Coherency WDL  Content WDL  Delusions None reported or observed  Perception WDL  Hallucination None reported or observed  Judgment Poor  Confusion None  Danger to Self  Current suicidal ideation? Denies  Danger to Others  Danger to Others None reported or observed

## 2023-12-02 NOTE — Group Note (Signed)
 Recreation Therapy Group Note   Group Topic:Team Building  Group Date: 12/02/2023 Start Time: 0940 End Time: 1005 Facilitators: Sebastion Jun-McCall, LRT,CTRS Location: [address] Dayroom   Group Topic: Communication, Team Building, Problem Solving  Goal Area(s) Addresses:  Patient will effectively work with peer towards shared goal.  Patient will identify skills used to make activity successful.  Patient will share challenges and verbalize solution-driven approaches used. Patient will identify how skills used during activity can be used to reach post d/c goals.   Behavioral Response:   Intervention: STEM Activity   Activity: [employer]. Patients were provided the following materials: 4 drinking straws, 5 rubber bands, 5 paper clips, 2 index cards and 2 drinking cups. Using the provided materials patients were asked to build a launching mechanism to launch a ping pong ball across the room, approximately 10 feet. Patients were divided into teams of 3-5. Instructions required all materials be incorporated into the device, functionality of items left to the peer group's discretion.  Education: Pharmacist, Community, Scientist, Physiological, Air Cabin Crew, Building Control Surveyor.   Education Outcome: Acknowledges education/In group clarification offered/Needs additional education.    Affect/Mood: N/A   Participation Level: Did not attend    Clinical Observations/Individualized Feedback:      Plan: Continue to engage patient in RT group sessions 2-3x/week.   Mariah Harn-McCall, LRT,CTRS 12/02/2023 12:29 PM

## 2023-12-02 NOTE — [facility] Group Notes (Signed)
 [facility] Group Notes:  (Nursing/MHT/Case Management/Adjunct)  Date:  12/02/2023  Time:  9:52 PM  Type of Therapy:  Psychoeducational Skills  Participation Level:  Did Not Attend  Participation Quality:  Did not attend   Affect:  Did not attend  Cognitive:  Did not attend   Insight:  None  Engagement in Group:  Did not attend   Modes of Intervention:  Education  Summary of Progress/Problems: Patient did not attend group this evening.   Cailyn Houdek S 12/02/2023, 9:52 PM

## 2023-12-03 NOTE — Group Note (Signed)
 Date:  12/03/2023 Time:  5:26 PM  Group Topic/Focus:  Goals Group:   The focus of this group is to help patients establish daily goals to achieve during treatment and discuss how the patient can incorporate goal setting into their daily lives to aide in recovery. Orientation:   The focus of this group is to educate the patient on the purpose and policies of crisis stabilization and provide a format to answer questions about their admission.  The group details unit policies and expectations of patients while admitted.    Participation Level:  Did Not Attend   [name] Mars 12/03/2023, 5:26 PM

## 2023-12-03 NOTE — Plan of Care (Signed)
 Problem: Education: Goal: Knowledge of [facility] Education information/materials will improve Outcome: Adequate for Discharge Goal: Emotional status will improve Outcome: Adequate for Discharge Goal: Mental status will improve Outcome: Adequate for Discharge Goal: Verbalization of understanding the information provided will improve Outcome: Adequate for Discharge   Problem: Activity: Goal: Interest or engagement in activities will improve Outcome: Adequate for Discharge Goal: Sleeping patterns will improve Outcome: Adequate for Discharge   Problem: Coping: Goal: Ability to verbalize frustrations and anger appropriately will improve Outcome: Adequate for Discharge Goal: Ability to demonstrate self-control will improve Outcome: Adequate for Discharge   Problem: Health Behavior/Discharge Planning: Goal: Identification of resources available to assist in meeting health care needs will improve Outcome: Adequate for Discharge Goal: Compliance with treatment plan for underlying cause of condition will improve Outcome: Adequate for Discharge   Problem: Physical Regulation: Goal: Ability to maintain clinical measurements within normal limits will improve Outcome: Adequate for Discharge   Problem: Safety: Goal: Periods of time without injury will increase Outcome: Adequate for Discharge   Problem: Education: Goal: Knowledge of [facility] Education information/materials will improve Outcome: Adequate for Discharge Goal: Emotional status will improve Outcome: Adequate for Discharge Goal: Mental status will improve Outcome: Adequate for Discharge Goal: Verbalization of understanding the information provided will improve Outcome: Adequate for Discharge   Problem: Activity: Goal: Interest or engagement in activities will improve Outcome: Adequate for Discharge Goal: Sleeping patterns will improve Outcome: Adequate for Discharge   Problem: Coping: Goal:  Ability to verbalize frustrations and anger appropriately will improve Outcome: Adequate for Discharge Goal: Ability to demonstrate self-control will improve Outcome: Adequate for Discharge   Problem: Health Behavior/Discharge Planning: Goal: Identification of resources available to assist in meeting health care needs will improve Outcome: Adequate for Discharge Goal: Compliance with treatment plan for underlying cause of condition will improve Outcome: Adequate for Discharge   Problem: Physical Regulation: Goal: Ability to maintain clinical measurements within normal limits will improve Outcome: Adequate for Discharge   Problem: Safety: Goal: Periods of time without injury will increase Outcome: Adequate for Discharge   Problem: Activity: Goal: Interest or engagement in leisure activities will improve Outcome: Adequate for Discharge Goal: Imbalance in normal sleep/wake cycle will improve Outcome: Adequate for Discharge   Problem: Coping: Goal: Coping ability will improve Outcome: Adequate for Discharge Goal: Will verbalize feelings Outcome: Adequate for Discharge   Problem: Coping: Goal: Ability to identify and develop effective coping behavior will improve Outcome: Adequate for Discharge   Problem: Self-Concept: Goal: Ability to identify factors that promote anxiety will improve Outcome: Adequate for Discharge Goal: Level of anxiety will decrease Outcome: Adequate for Discharge Goal: Ability to modify response to factors that promote anxiety will improve Outcome: Adequate for Discharge   Problem: Health Behavior/Discharge Planning: Goal: Ability to identify changes in lifestyle to reduce recurrence of condition will improve Outcome: Adequate for Discharge Goal: Identification of resources available to assist in meeting health care needs will improve Outcome: Adequate for Discharge   Problem: Physical Regulation: Goal: Complications related to the disease process,  condition or treatment will be avoided or minimized Outcome: Adequate for Discharge   Problem: Safety: Goal: Ability to remain free from injury will improve Outcome: Adequate for Discharge   Problem: Coping: Goal: Ability to identify and develop effective coping behavior will improve Outcome: Adequate for Discharge Goal: Ability to interact with others will improve Outcome: Adequate for Discharge Goal: Demonstration of participation in decision-making regarding own care will improve Outcome: Adequate for Discharge  Goal: Ability to use eye contact when communicating with others will improve Outcome: Adequate for Discharge  Patient discharged to home self care care plan

## 2023-12-03 NOTE — [facility] Suicide Risk Assessment (Signed)
 [facility] Discharge Suicide Risk Assessment   Principal Problem: MDD (major depressive disorder) Discharge Diagnoses: Principal Problem:   MDD (major depressive disorder)   Demographic Factors:  Divorced or widowed, Caucasian, Living alone, and Unemployed  Loss Factors: Loss of significant relationship  Historical Factors: Family history of suicide, Family history of mental illness or substance abuse, and Anniversary of important loss  Risk Reduction Factors:   Sense of responsibility to family and Positive social support  Continued Clinical Symptoms:  MDD  Cognitive Features That Contribute To Risk:  None    Suicide Risk:  Mild:  Suicidal ideation of limited frequency, intensity, duration, and specificity.  There are no identifiable plans, no associated intent, mild dysphoria and related symptoms, good self-control (both objective and subjective assessment), few other risk factors, and identifiable protective factors, including available and accessible social support.   Follow-up Information     [facility], [facility]. Go on 12/09/2023.   Why: You have a hospital follow up appointment for therapy services on 12/09/23 at 10:00 am .  The appointment will be held in person.  Please bring photo ID, insurance card with you.  Medication management is also available. Contact information: [address] [place] [address] [phone]                  Brooke DELENA Salmon, DO 12/03/2023, 9:29 AM

## 2023-12-03 NOTE — Progress Notes (Signed)
  [facility] Adult Case Management Discharge Plan :  Will you be returning to the same living situation after discharge:  Yes,  Pt returning home. At discharge, do you have transportation home?: Yes,  son will pick pt up at 10:30 AM Do you have the ability to pay for your medications: Yes,  Pt has medical insurance.  Release of information consent forms completed and in the chart;  Patient's signature needed at discharge.  Patient to Follow up at:  Follow-up Information     [facility], [facility]. Go on 12/09/2023.   Why: You have a hospital follow up appointment for therapy services on 12/09/23 at 10:00 am .  The appointment will be held in person.  Please bring photo ID, insurance card with you.  Medication management is also available. Contact information: [address] [place] [address] [phone]                 Next level of care provider has access to [facility] Link:no  Safety Planning and Suicide Prevention discussed: Yes,  completed with son, Verlee Pope [phone].     Has patient been referred to the Quitline?: Patient refused referral for treatment  Patient has been referred for addiction treatment: No known substance use disorder.  Derick JONELLE Blanch, LCSW 12/03/2023, 9:26 AM

## 2023-12-03 NOTE — Progress Notes (Signed)
(  Sleep Hours) - 12 (Any PRNs that were needed, meds refused, or side effects to meds)- Trazodone , Tylenol , Vistaril  (Any disturbances and when (visitation, over night)- n/a (Concerns raised by the patient)- none (SI/HI/AVH)- denies

## 2023-12-03 NOTE — Progress Notes (Signed)
 Patient ID: [name], female   DOB: 01-28-1970, 53 y.o.   MRN: [id number] Patient was discharged to home/self care in the company of her daughter and grand children. Patient denies SI, HI, and AVH upon discharge. Patient was noted to have a bright affect. Patient was eager to return to her family and stated understanding of all discharge instructions and receipt of personal belongings.Patient successfully discharged.
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
